# Patient Record
Sex: Male | Born: 1965 | Race: White | Hispanic: No | Marital: Married | Smoking: Never smoker
Health system: Southern US, Community
[De-identification: ages and names within clinical notes are randomized; demographics above are authoritative.]

## PROBLEM LIST (undated history)

## (undated) DIAGNOSIS — I10 Essential (primary) hypertension: Secondary | ICD-10-CM

## (undated) DIAGNOSIS — Z9289 Personal history of other medical treatment: Secondary | ICD-10-CM

## (undated) DIAGNOSIS — I2510 Atherosclerotic heart disease of native coronary artery without angina pectoris: Secondary | ICD-10-CM

## (undated) HISTORY — DX: Atherosclerotic heart disease of native coronary artery without angina pectoris: I25.10

## (undated) HISTORY — DX: Personal history of other medical treatment: Z92.89

## (undated) HISTORY — DX: Essential (primary) hypertension: I10

---
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
## 2004-04-18 HISTORY — PX: HERNIA REPAIR: SHX51

## 2008-04-18 HISTORY — PX: CARDIAC CATHETERIZATION: SHX172

## 2008-04-29 ENCOUNTER — Inpatient Hospital Stay (HOSPITAL_COMMUNITY): Admission: AD | Admit: 2008-04-29 | Discharge: 2008-04-30 | Payer: Self-pay | Admitting: Cardiovascular Disease

## 2008-05-16 ENCOUNTER — Inpatient Hospital Stay (HOSPITAL_COMMUNITY): Admission: RE | Admit: 2008-05-16 | Discharge: 2008-05-17 | Payer: Self-pay | Admitting: Cardiology

## 2009-06-09 DIAGNOSIS — Z9289 Personal history of other medical treatment: Secondary | ICD-10-CM

## 2009-06-09 HISTORY — DX: Personal history of other medical treatment: Z92.89

## 2010-08-02 LAB — BASIC METABOLIC PANEL
BUN: 9 mg/dL (ref 6–23)
BUN: 9 mg/dL (ref 6–23)
BUN: 14 mg/dL (ref 6–23)
CO2: 27 mEq/L (ref 19–32)
CO2: 27 mEq/L (ref 19–32)
Calcium: 9.1 mg/dL (ref 8.4–10.5)
Chloride: 102 mEq/L (ref 96–112)
Creatinine, Ser: 1.05 mg/dL (ref 0.4–1.5)
Creatinine, Ser: 1.23 mg/dL (ref 0.4–1.5)
GFR calc Af Amer: >60 mL/min (ref >60)
GFR calc non Af Amer: >60 mL/min (ref >60)
Glucose, Bld: 120 mg/dL — ABNORMAL HIGH (ref 70–99)
Glucose, Bld: 96 mg/dL (ref 70–99)
Potassium: 4.2 mEq/L (ref 3.5–5.1)
Potassium: 4.4 mEq/L (ref 3.5–5.1)

## 2010-08-02 LAB — CBC
HCT: 43 % (ref 39.0–52.0)
HCT: 38.2 % — ABNORMAL LOW (ref 39.0–52.0)
HCT: 38.3 % — ABNORMAL LOW (ref 39.0–52.0)
MCHC: 33.4 g/dL (ref 30.0–36.0)
MCHC: 34.2 g/dL (ref 30.0–36.0)
MCHC: 34.4 g/dL (ref 30.0–36.0)
MCV: 87 fL (ref 78.0–100.0)
MCV: 87.1 fL (ref 78.0–100.0)
Platelets: 207 10*3/uL (ref 150–400)
Platelets: 233 10*3/uL (ref 150–400)
Platelets: 194 10*3/uL (ref 150–400)
RBC: 4.39 MIL/uL (ref 4.22–5.81)
RBC: 4.41 MIL/uL (ref 4.22–5.81)
RDW: 12.6 % (ref 11.5–15.5)
RDW: 13.2 % (ref 11.5–15.5)
WBC: 5.6 10*3/uL (ref 4.0–10.5)

## 2010-08-02 LAB — DIFFERENTIAL
Basophils Relative: 1 % (ref 0–1)
Eosinophils Absolute: 0.1 10*3/uL (ref 0.0–0.7)
Eosinophils Relative: 2 % (ref 0–5)
Lymphs Abs: 1.5 10*3/uL (ref 0.7–4.0)
Monocytes Relative: 7 % (ref 3–12)
Neutrophils Relative %: 66 % (ref 43–77)

## 2010-08-02 LAB — D-DIMER, QUANTITATIVE: D-Dimer, Quant: <0.22 ug/mL-FEU (ref 0.00–0.48)

## 2010-08-02 LAB — TSH: TSH: 1.752 u[IU]/mL (ref 0.350–4.500)

## 2010-08-02 LAB — LIPID PANEL: Cholesterol: 116 mg/dL (ref 0–200)

## 2010-08-31 NOTE — Cardiovascular Report (Signed)
Tyler Acevedo, Tyler Acevedo              ACCOUNT NO.:  [id number]   MEDICAL RECORD NO.:  [id number]          PATIENT TYPE:  INP   LOCATION:  2013                         FACILITY:  [facility]   PHYSICIAN:  Richard A. Alanda Amass, M.D.DATE OF BIRTH:  August 04, 1965   DATE OF PROCEDURE:  DATE OF DISCHARGE:                            CARDIAC CATHETERIZATION   PROCEDURE:  Left coronary angiography, fractional flow reserve  measurement after hyperemia with intracoronary adenosine administration  LAD proximal, IVUS interrogation mid LAD lesion with Atlantis Pro 40 MHz  IVUS catheter.  The left femoral artery closure with 6-French Angio-Seal  device, successful.   BRIEF HISTORY:  Please refer to Dr. Verl Dicker brief H&P of April 29, 2008 and his cath report of April 29, 2008.   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  He has a family history of  coronary disease and presented to Dr. Jacinto Halim for evaluation of episodes  of chest pain felt to be compatible with ischemia.  He underwent  diagnostic catheterization on April 29, 2008 at the [facility] which demonstrated a borderline 50-60% angiographically hazy  lesion in the proximal LAD before the large first diagonal.  The  remainder of his coronaries including the LAD, diagonal system,  circumflex, and right coronary artery were entirely normal.  He also had  recent onset hypertension and had normal single renal artery in the  right and double renal artery on the left.  LV function was normal.   Because of the borderline angiographic nature of his lesion associated  with these new onset cardiac symptoms, Dr. Jacinto Halim felt it was best to  proceed with further evaluation in the cath lab.  He was transferred to  [facility]  The right groin sheath had been pulled and was stable.  Informed consent was obtained from the patient's family to proceed with  left coronary angiography and  IVUS and probable fractional flow reserve  measurement.  I reviewed the patient's coronary angiography from Dr.  Jacinto Halim and agreed that there is 50-60%, slightly hazy, nonthrombotic,  eccentric, and mildly concentric lesion of the LAD proximally.  I felt  that we needed as much information as possible, so I planned to do both  FFR and IVUS interrogation.   The left groin was prepped, draped in usual manner.  Lidocaine 1% was  used for local anesthesia and CFA was entered with a single anterior  puncture using 18 thin-wall needle and a 6-French short left femoral  arterial sheath was inserted without difficulty.  The patient was given  3000 units of heparin.  ACT was monitored and was greater than 300  seconds.  The lesion was crossed through a JL-4 6-French guiding  catheter with 0.014-inch Volcano flow wire.  The wire and guiding  catheter were easily sensitive at the left coronary ostia.  The flow  wire transducer was across the lesion and the patient was given  initially 42 mcg of intracoronary adenosine with flow wire measurements  obtained immediately following this.  A second measurement was done  after  48 mcg of intracoronary adenosine.  Fractional flow reserve was  0.91 with good waveforms present.  The flow wire was kept in place since  it was safely across the lesion in the LAD and we had elected to go  ahead and do IVUS interrogation.  This was done with an Atlantis Pro 40  MHz IVUS catheter.  This was positioned distal to the lesion for  reference measurements and automated pullback was performed through the  left main coronary artery.   IVUS interrogation revealed an eccentric soft plaque mildly segmental in  the proximal LAD as viewed angiographically.  The maximum diameter  reduction was 2.63 x 3.02 mm at the lesion site with a cross-sectional  area of 6.25 mm sq.   The reference diameter just distal to the lesion in this large LAD was  3.8 x 4.68 mm with a CSA of 14.72  mm sq.   Calculated stenosis by cross-sectional area was 59%.   This was felt to not be hemodynamically significant by angiography which  was repeated of the left coronary today, fractional flow reserve  measurement of greater than 0.91 and IVUS interrogation as outlined  above.   The patient had soft plaque with single vessel disease in a young man.  I would recommend continued medical therapy which might include dual  antiplatelet therapy, statin, and beta-blockers.  Our hopes with soft  plaque may be that this patient may develop progression of his disease.  We will have him followup as an outpatient with Dr. Jacinto Halim.   CATHETERIZATION DIAGNOSES:  1. Recent onset clinical angina.  2. Recent onset hypertension, normal renal artery.  3. Borderline proximal LAD with 50-60% angiographic stenosis at      diagnostic cath by Dr. Jacinto Halim on April 29, 2008.  4. Hemodynamically not significant lesion by fractional flow reserve      at 0.91.  5. IVUS interrogation demonstrating adequate residual diameter and CSA      as outlined above with 59% stenosis by cross-sectional area      measurement.  6. Mild hyperlipidemia.      Richard A. Alanda Amass, M.D.  Electronically Signed     RAW/MEDQ  D:  04/29/2008  T:  04/30/2008  Job:  [id number]   cc:   Cristy Hilts. Jacinto Halim, MD  Cornelius Moras Orvan Falconer,, MD

## 2010-08-31 NOTE — Cardiovascular Report (Signed)
NAMEDIMITRIOUS, MICCICHE              ACCOUNT NO.:  [id number]   MEDICAL RECORD NO.:  [id number]          PATIENT TYPE:  INP   LOCATION:  3728                         FACILITY:  [facility]   PHYSICIAN:  Cristy Hilts. Jacinto Halim, MD       DATE OF BIRTH:  10/22/1965   DATE OF PROCEDURE:  05/16/2008  DATE OF DISCHARGE:                            CARDIAC CATHETERIZATION   PROCEDURE PERFORMED:  Coronary arteriography only.   INDICATIONS:  Mr. Sakai Wolford is a 42-year gentleman who has known  intermediate coronary artery disease in the LAD.  He had a diagnostic  cardiac catheterization on April 29, 2008.  At that time, he was found  to have a hazy 60% mid LAD stenosis.  He also underwent intravascular  ultrasound and also pressure wire evaluation with the significance of  the stenosis.  It was felt to be not significant.  He had a 60%  eccentric soft plaque.  The lesion, although hazy, he was advised  aggressive medical therapy.  He has been having increasing angina  pectoris and had to use sublingual nitroglycerin.  He has also noted  shortness of breath.  Given this initially, although he was scheduled  for a stress Myoview, this was cancelled and is now brought to the  catheterization lab to reevaluate his coronary anatomy with a eye  towards angioplasty to his mid-LAD stenosis.   ANGIOGRAPHIC DATA:  Left main coronary artery:  Left main coronary  artery is a large-caliber vessel.  Smooth and normal.   Circumflex:  The circumflex is large-caliber vessel.  Smooth and normal.   LAD:  The LAD has a ostial 10% stenosis.  The mid LAD again has a  minimally hazy 30%, at most 40% stenosis eccentrically.  This is much  improved compared to the prior cardiac catheterization just a few days  ago.  The haziness is also markedly improved.   RECOMMENDATIONS:  Based on the anatomy, I do not think we should proceed  with angioplasty.  He needs to be given medical therapy only at this  time.  I will perform a  treadmill exercise stress test to evaluate his  functional status as an outpatient basis at cardiac rehab.  Ranexa to  the present medical regimen would clearly benefit his symptomatology.   TECHNIQUE OF PROCEDURE:  Under usual sterile precautions using a 7-  French right femoral artery access, a 7-French XB4 LAD guide was  utilized to engage the left main coronary artery.  Angiomax was utilized  for  anticoagulation.  Angiography was performed.  Having determined that the  stenosis was not severe enough, the procedure was abandoned and the  catheter was pulled out the body in the usual fashion.  Right femoral  arteriography was performed through the arterial access sheath and  access closed with Star-Close with excellent hemostasis.      Cristy Hilts. Jacinto Halim, MD  Electronically Signed     Cristy Hilts. Jacinto Halim, MD  Electronically Signed    JRG/MEDQ  D:  05/16/2008  T:  05/16/2008  Job:  [id number]

## 2010-08-31 NOTE — Discharge Summary (Signed)
Tyler Acevedo, Tyler Acevedo              ACCOUNT NO.:  [id number]   MEDICAL RECORD NO.:  [id number]          PATIENT TYPE:  INP   LOCATION:  2013                         FACILITY:  [facility]   PHYSICIAN:  Cristy Hilts. Jacinto Halim, MD       DATE OF BIRTH:  20-Jul-1965   DATE OF ADMISSION:  04/29/2008  DATE OF DISCHARGE:  04/30/2008                               DISCHARGE SUMMARY   Mr. Tyler Acevedo is a 45 year old male patient who had seen Dr. Jacinto Halim  in the office with hypertension and chest pain.  He was set up to have  an outpatient cardiac catheterization and this was performed at  [facility] on April 29, 2008.  It revealed a 50-60% hazy  smooth stenosis.  Dr. Jacinto Halim felt that this was a significant plaque  burden, thus he was brought to [facility] by EMS for IV ultrasound and  probable intervention to his LAD.  Plavix was started.  His renal  arteries were without any disease.  He underwent the IV ultrasound by  Dr. Susa Griffins and he had findings of extensive soft plaque  proximal LAD which was large, 2.63 x 3.0 mm diameter, 59% stenosis was  determined.  He also had fractional flow rate done with intracoronary  adenosine.  Fractional flow rate was 0.91.  Please see Dr. Rocco Serene complete cath note for details.  The following day, he was  doing well.  No intervention was done.  It was decided that he would be  treated medically and to have close followup in the office.  On April 30, 2008, his blood pressure was 95/54, pulse was 77, and temperature  was 97.   LABORATORY DATA:  Total cholesterol is 116, triglycerides is 236, HDL is  27, and LDL is 42.  Sodium 137, potassium 3.6, BUN 9, creatinine 1.05,  and glucose is 120.  Hemoglobin 13.5, hematocrit 39.4, WBC 7.5, and  platelets 233.  No chest x-ray was done.   DISCHARGE MEDICATIONS:  1. Bystolic 5 mg daily.  2. Aspirin 325 mg a day or 2 baby aspirin a day.  3. He should increase his Crestor to 20 mg daily.  4.  Start Plavix 75 mg a day.  5. Altace 2.5 mg a day.  6. Nitroglycerin spray on a p.r.n. basis 1 spray every 5 minutes x3 if      needed for chest pain.  7. Xanax 0.25 mg 1 every 8 hours if needed for anxiety.   He will follow up with Dr. Jacinto Halim on May 08, 2008, at 12:15.  he  should do no strenuous activity for a week.  No lifting, pushing, or  pulling x1 week.  If he has any problems with his groin, he is to give  our office a call.  He should not sit in a tub of water for 1 week.  He  should return to work in 2-1/2 weeks.  He should be on a low-sodium diet  and lose weight.   DISCHARGE DIAGNOSES:  1. Angina.  2. Coronary artery disease, status post cardiac catheterization with a  60% stenosis in his left anterior descending, being treated      medically for now.  He did have IV ultrasound and fractional flow      rates done.  3. Normal ejection fraction.  4. No renal artery stenosis.  5. Hypertension.  6. Dyslipidemia with low HDL and high triglycerides.  7. Elevated glucose.  Hemoglobin A1c should be evaluated as an      outpatient.  8. Obesity.  He was recommended to lose weight.      Lezlie Octave, N.P.      Cristy Hilts. Jacinto Halim, MD  Electronically Signed    BB/MEDQ  D:  04/30/2008  T:  04/30/2008  Job:  [id number]   cc:   Mila Homer. Orvan Falconer, MD

## 2010-08-31 NOTE — Discharge Summary (Signed)
Tyler Acevedo, Tyler Acevedo              ACCOUNT NO.:  [id number]   MEDICAL RECORD NO.:  [id number]          PATIENT TYPE:  INP   LOCATION:  3728                         FACILITY:  [facility]   PHYSICIAN:  Cristy Hilts. Jacinto Halim, MD       DATE OF BIRTH:  09-08-1965   DATE OF ADMISSION:  05/16/2008  DATE OF DISCHARGE:  05/17/2008                               DISCHARGE SUMMARY   DISCHARGE DIAGNOSES:  1. Coronary artery disease with chest pain.  1A.  Improved to left anterior descending stenosis reduced from 60% to  30-40% and left pacing.  1. Abnormal EKG stable.  2. Mild hypertension, now treated.  3. Family history of premature coronary artery disease.  4. Dyslipidemia.  On discharge, condition improved.  5. Elevated state of anxiety.   PROCEDURES:  Cath of the left main including the left anterior  descending, now only 30-40% stenosis and was hazy on April 29, 2008.   DISCHARGE MEDICATIONS:  1. Bystolic.  We have decreased the dose from 5 mg to 2.5 mg daily.  2. Crestor 20 mg daily.  3. Aspirin 325 mg daily.  4. Plavix 75 mg daily.  5. Ramipril.  We are holding for now, secondary to blood pressure.  6. We have added Ranexa 500 mg one twice a day.  7. Effexor XR 150 mg daily.  8. Nitroglycerin 0.4 mg sublingual as needed for chest pain.  9. Alprazolam 0.25 mg 1 every 8 hours as needed.   DISCHARGE INSTRUCTIONS:  1. No work until or after you see Dr. Jacinto Halim.  Dr. Jacinto Halim has already      given him out 2 weeks.  2. Heart-healthy diet.  3. Increase activity slowly.  May shower.  No lifting for 2 days.  No      driving for 2 days.  4. Wash cath site with soap and water.  5. Call if any bleeding, swelling, or drainage.  6. Follow up with Dr. Jacinto Halim in the week of May 19, 2008, the      office will call with date and time.  7. Please remember no ramipril for now, secondary to borderline blood      pressure.   HISTORY OF PRESENT ILLNESS:  A 45 year old white male with premature  family history  of coronary artery disease was having chest pain.  He  underwent cardiac catheterization with Dr. Alanda Amass and was found to  have 60% LAD stenosis that appeared hazy.  He was transferred to [facility] and had a intravascular ultrasound and Doppler flow wire evaluation  of the lesion severity, and was found not to be hemodynamically  significant.  He was discharged with aggressive medical therapy.  He was  then seen in followup by Dr. Jacinto Halim, May 08, 2008, for 1 episode of  chest discomfort with radiation to his left arm.  Occasional chest  discomfort of heaviness, but otherwise had felt fairly well.  He was  evaluated and the patient was somewhat anxious about the blockage and  the continued chest discomfort.  Initially, Dr. 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  The patient underwent procedure and actually  the area has improved.  Because of the left haziness, it was easier to  determine the stenosis, which is 30-40%.  The patient was reassured,  though Dr. Jacinto Halim feels he needs aggressive medical therapy.  Ranexa was  added and the patient will need a stress test in the office as well as  his cardiac rehab, but Dr. Jacinto Halim will see him next week to determine  this.  The patient will be out of work until that time.   Please note, the patient did have StarClose devices, so he should not  swim or have tub baths for 1 week.   LABORATORY DATA:  Postprocedure; hemoglobin 13.2, hematocrit 38, WBC is  6, platelets 194, and MCV 87.  Chemistry; sodium 137, potassium 4.4,  chloride 102, CO2 of 27, BUN 14, creatinine 1.23, glucose 99, and  calcium 8.9.  Chest x-ray on admission, prior to procedure no acute  disease.  Lungs were clear.  Heart size normal.  No pleural effusion or  focal abnormality.   Please note, after the procedure, after  his first dose of Ranexa, his  blood pressure had dropped to 92, had some chest discomfort and was  somewhat dizzy.  He was getting 150 mL of fluid and hour at that time,  we did a EKG and there were no acute changes.  He was bradycardic, but  only mildly.  He was reassured and symptoms resolved.  We did hold his  blood pressure medicines that night, but we did give the Ranexa.   By the morning of May 17, 2008, he was seen and evaluated by Dr.  Jacinto Halim.  Dr. Jacinto Halim felt due to his anxiety, Effexor would be a good  medication, so this was added as well as the Ranexa.  He will follow up  as an outpatient and Dr. Jacinto Halim will decide timing of treadmill test and  cardiac rehab.      Darcella Gasman. [place], N.P.      Cristy Hilts. Jacinto Halim, MD  Electronically Signed    LRI/MEDQ  D:  05/17/2008  T:  05/17/2008  Job:  [id number]   cc:   Cristy Hilts. Jacinto Halim, MD  Junious Dresser, MD

## 2012-10-22 ENCOUNTER — Other Ambulatory Visit: Payer: Self-pay | Admitting: *Deleted

## 2012-10-22 MED ORDER — RANOLAZINE ER 500 MG PO TB12: 500.0000 mg | ORAL_TABLET | Freq: Two times a day (BID) | ORAL | Status: DC

## 2012-11-09 ENCOUNTER — Encounter: Payer: Self-pay | Admitting: *Deleted

## 2012-11-12 ENCOUNTER — Encounter: Payer: Self-pay | Admitting: Internal Medicine

## 2012-11-12 ENCOUNTER — Ambulatory Visit (INDEPENDENT_AMBULATORY_CARE_PROVIDER_SITE_OTHER): Payer: BC Managed Care – PPO | Admitting: Internal Medicine

## 2012-11-12 VITALS — BP 120/82 | HR 64 | Ht 68.0 in | Wt 205.4 lb

## 2012-11-12 DIAGNOSIS — Z79899 Other long term (current) drug therapy: Secondary | ICD-10-CM

## 2012-11-12 DIAGNOSIS — I2510 Atherosclerotic heart disease of native coronary artery without angina pectoris: Secondary | ICD-10-CM

## 2012-11-12 DIAGNOSIS — E785 Hyperlipidemia, unspecified: Secondary | ICD-10-CM

## 2012-11-12 DIAGNOSIS — Z021 Encounter for pre-employment examination: Secondary | ICD-10-CM

## 2012-11-12 DIAGNOSIS — Z0289 Encounter for other administrative examinations: Secondary | ICD-10-CM

## 2012-11-12 LAB — COMPREHENSIVE METABOLIC PANEL
BUN: 11 mg/dL (ref 6–23)
CO2: 28 mEq/L (ref 19–32)
Calcium: 9 mg/dL (ref 8.4–10.5)
Chloride: 107 mEq/L (ref 96–112)
Creat: 1.1 mg/dL (ref 0.50–1.35)
Glucose, Bld: 101 mg/dL — ABNORMAL HIGH (ref 70–99)
Total Bilirubin: 0.7 mg/dL (ref 0.3–1.2)

## 2012-11-12 MED ORDER — RANOLAZINE ER 500 MG PO TB12: 500.0000 mg | ORAL_TABLET | Freq: Two times a day (BID) | ORAL | Status: DC

## 2012-11-12 NOTE — Patient Instructions (Addendum)
Your physician wants you to follow-up in: 1 year. You will receive a reminder letter in the mail two months in advance. If you don't receive a letter, please call our office to schedule the follow-up appointment.  Your physician recommends that you return for lab work today. NMR with lipid, CMP

## 2012-11-12 NOTE — Progress Notes (Signed)
OFFICE NOTE  Chief Complaint:  Yearly followup, DOT physical  Primary Care Physician: No primary provider on file.  HPI:  Tyler Acevedo.  is a 47 year old gentleman who we are following for annual DOT physicals and who is here for a history of coronary artery disease by cardiac catheterization in 2010. He had a 60% mid LAD lesion which was negative by FFR and not significant by IVUS evaluation. He did have soft plaque on IVUS, and it was recommended that he undergo intensive medical therapy, which he is currently on. 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 There has been no clear indication for repeat stress testing, and overall again he is doing well.  PMHx:  Past Medical History  Diagnosis Date  . CAD (coronary artery disease)   . Hypertension   . History of nuclear stress test 06/09/2009    exericse myoview; post stress EF 69%; evidence of mild ischemia in basal anteroseptal region    Past Surgical History  Procedure Laterality Date  . Cardiac catheterization  2010    LA has ostial 10% stenosis, mid LAD hazy 30-40% stenosis  . Wrist surgery  1982  . Nose surgery  1985  . Knee surgery  1987  . Hernia repair  2006    FAMHx:  Family History  Problem Relation Age of Onset  . Heart attack Father   . Hypertension Father   . Other Paternal Grandmother     Parkinson's    SOCHx:   reports that he has never smoked. He has never used smokeless tobacco. He reports that he does not drink alcohol or use illicit drugs.  ALLERGIES:  No Known Allergies  ROS: A comprehensive review of systems was negative.  HOME MEDS: Current Outpatient Prescriptions  Medication Sig Dispense Refill  . aspirin 81 MG tablet Take 81 mg by mouth daily.      Marland Kitchen atorvastatin (LIPITOR) 80 MG tablet Take 80 mg by mouth daily.      . clopidogrel (PLAVIX) 75 MG tablet Take 75 mg by mouth daily.      .  metoprolol succinate (TOPROL-XL) 25 MG 24 hr tablet Take 25 mg by mouth daily.      . Omega-3 Fatty Acids (FISH OIL) 300 MG CAPS Take 2 capsules by mouth daily.      . ranolazine (RANEXA) 500 MG 12 hr tablet Take 1 tablet (500 mg total) by mouth 2 (two) times daily.  56 tablet  0  . venlafaxine XR (EFFEXOR-XR) 37.5 MG 24 hr capsule Take 37.5 mg by mouth daily.      . vitamin Acevedo (ASCORBIC ACID) 500 MG tablet Take 500 mg by mouth daily.       No current facility-administered medications for this visit.    LABS/IMAGING: No results found for this or any previous visit (from the past 48 hour(s)). No results found.  VITALS: Pulse 64  Ht 5\' 8"  (1.727 m)  Wt 205 lb 6.4 oz (93.169 kg)  BMI 31.24 kg/m2  EXAM: General appearance: alert and no distress Neck: no adenopathy, no carotid bruit, no JVD, supple, symmetrical, trachea midline and thyroid not enlarged, symmetric, no tenderness/mass/nodules Lungs: clear to auscultation bilaterally Heart: regular rate and rhythm, S1, S2 normal, no murmur, click, rub or gallop Abdomen: soft, non-tender; bowel sounds normal; no masses,  no organomegaly Extremities: extremities normal, atraumatic, no cyanosis or edema Pulses: 2+ and symmetric Skin: Skin  color, texture, turgor normal. No rashes or lesions Neurologic: Grossly normal  EKG: Normal sinus rhythm at 64  ASSESSMENT: 1. Coronary artery disease with a 60% mid LAD lesion in 2010 2. Dyslipidemia 3. Hypertension  PLAN: 1.   [name] continues to do well without any recurrent chest pain. He has done very well with Ranexa and wants to continue to use that. He is overdue for a lipid profile testing but is continuing to take high-dose Lipitor. His exercise could be better, but he maintains activity and strenuous lifting without any chest pain or worsening shortness of breath. 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 I will contact him with the results of his blood work and have encouraged him to followup with a primary care provider.  Chrystie Nose, MD, [facility] Attending Cardiologist [facility]  Tyler Acevedo,Tyler Acevedo 11/12/2012, 10:26 AM

## 2012-11-13 LAB — NMR LIPOPROFILE WITH LIPIDS
Cholesterol, Total: 85 mg/dL (ref <200)
HDL Particle Number: 34 umol/L (ref >=30.5)
LDL (calc): 28 mg/dL (ref <100)
LP-IR Score: 53 — ABNORMAL HIGH (ref <=45)
Large VLDL-P: 2.1 nmol/L (ref <=2.7)
Small LDL Particle Number: 129 nmol/L (ref <=527)

## 2012-11-15 NOTE — Progress Notes (Signed)
Left voicemail for patient to return call.

## 2012-11-16 ENCOUNTER — Telehealth: Payer: Self-pay | Admitting: Internal Medicine

## 2012-11-16 DIAGNOSIS — E782 Mixed hyperlipidemia: Secondary | ICD-10-CM

## 2012-11-16 NOTE — Telephone Encounter (Signed)
Message copied by Chauncey Reading on Fri Nov 16, 2012 10:22 AM ------      Message from: Chrystie Nose      Created: Tue Nov 13, 2012  1:23 PM       Please inform him that his cholesterol is remarkably low. LDL is 28, total LDL particle number 333 (should be <1000). I would recommend reducing his lipitor to 40 mg daily (to reduce side effects) and order a re-check lipid NMR in 3 months.             Dr. Rennis Golden ------

## 2012-11-16 NOTE — Telephone Encounter (Signed)
Patient states that he is returning a call about his lab results.

## 2012-11-16 NOTE — Telephone Encounter (Signed)
Call to pt and results given per MD.  Pt verbalized understanding.    Labs ordered and slip mailed.

## 2012-12-03 ENCOUNTER — Telehealth: Payer: Self-pay | Admitting: Internal Medicine

## 2012-12-03 MED ORDER — ATORVASTATIN CALCIUM 40 MG PO TABS: 40.0000 mg | ORAL_TABLET | Freq: Every day | ORAL | Status: DC

## 2012-12-03 MED ORDER — RANOLAZINE ER 500 MG PO TB12: 500.0000 mg | ORAL_TABLET | Freq: Two times a day (BID) | ORAL | Status: DC

## 2012-12-03 MED ORDER — CLOPIDOGREL BISULFATE 75 MG PO TABS: 75.0000 mg | ORAL_TABLET | Freq: Every day | ORAL | Status: DC

## 2012-12-03 NOTE — Telephone Encounter (Signed)
Returned call Tyler Acevedo, pt's wife.  Stated pt is needing refill on cholesterol med.  Stated dose was reduced and new Rx not sent to pharmacy.  Informed Rx will be sent.  Wife stated pt is due for refills on almost all his meds, including Effexor.  Informed RN can send in refills on all meds except Effexor, which will need to be approved by Dr. Rennis Golden.  Verbalized understanding and stated it is not due quite yet.  Wife informed of refill process and verbalized understanding.  Requested 90-day supply refills on Atorvastatin and Plavix and 30-day supply on Ranexa.  Unable to come get samples.  Informed they will be sent.  Verbalized understanding.  Refill(s) sent to pharmacy.

## 2012-12-03 NOTE — Telephone Encounter (Signed)
Wife states Tyler Acevedo cholesterol med was to be reduced and the pharmacy has not done that.. She has questions about his medications.

## 2013-02-11 ENCOUNTER — Other Ambulatory Visit: Payer: Self-pay | Admitting: Internal Medicine

## 2013-02-11 NOTE — Telephone Encounter (Signed)
Rx was sent to pharmacy electronically. 

## 2013-02-19 ENCOUNTER — Other Ambulatory Visit: Payer: Self-pay | Admitting: Internal Medicine

## 2013-06-20 ENCOUNTER — Telehealth: Payer: Self-pay | Admitting: Internal Medicine

## 2013-06-20 MED ORDER — RANOLAZINE ER 500 MG PO TB12: 500.0000 mg | ORAL_TABLET | Freq: Two times a day (BID) | ORAL | Status: DC

## 2013-06-20 NOTE — Telephone Encounter (Signed)
Need some samples of Ranexa please. °

## 2013-06-20 NOTE — Telephone Encounter (Signed)
Notified patient that samples will be at front desk

## 2013-08-01 ENCOUNTER — Other Ambulatory Visit: Payer: Self-pay | Admitting: Internal Medicine

## 2013-08-01 NOTE — Telephone Encounter (Signed)
Rx was sent to pharmacy electronically. 

## 2013-09-03 ENCOUNTER — Telehealth: Payer: Self-pay | Admitting: Internal Medicine

## 2013-09-05 ENCOUNTER — Ambulatory Visit: Payer: BC Managed Care – PPO | Admitting: Internal Medicine

## 2013-09-05 NOTE — Telephone Encounter (Signed)
Closed encounter °

## 2013-10-14 ENCOUNTER — Encounter: Payer: Self-pay | Admitting: Internal Medicine

## 2013-10-14 ENCOUNTER — Ambulatory Visit (INDEPENDENT_AMBULATORY_CARE_PROVIDER_SITE_OTHER): Payer: BC Managed Care – PPO | Admitting: Internal Medicine

## 2013-10-14 VITALS — BP 130/88 | HR 64 | Ht 68.0 in | Wt 209.8 lb

## 2013-10-14 DIAGNOSIS — I10 Essential (primary) hypertension: Secondary | ICD-10-CM | POA: Insufficient documentation

## 2013-10-14 DIAGNOSIS — I2510 Atherosclerotic heart disease of native coronary artery without angina pectoris: Secondary | ICD-10-CM | POA: Insufficient documentation

## 2013-10-14 DIAGNOSIS — I2583 Coronary atherosclerosis due to lipid rich plaque: Secondary | ICD-10-CM

## 2013-10-14 DIAGNOSIS — E785 Hyperlipidemia, unspecified: Secondary | ICD-10-CM

## 2013-10-14 DIAGNOSIS — E782 Mixed hyperlipidemia: Secondary | ICD-10-CM

## 2013-10-14 MED ORDER — RANOLAZINE ER 500 MG PO TB12: 500.0000 mg | ORAL_TABLET | Freq: Two times a day (BID) | ORAL | Status: DC

## 2013-10-14 NOTE — Patient Instructions (Signed)
Your physician recommends that you return for lab work in: TODAY at [facility].  Your physician recommends that you schedule a follow-up appointment in: Dr. Rennis GoldenHilty in one year.

## 2013-10-14 NOTE — Progress Notes (Signed)
OFFICE NOTE  Chief Complaint:  Yearly followup, DOT physical  Primary Care Physician: No PCP Per Patient  HPI:  Tyler AngJoseph D Chiaramonte Jr.  is a 48 year old gentleman who we are following for annual DOT Acevedo and who is here for a history of coronary artery disease by cardiac catheterization in 2010. He had a 60% mid LAD lesion which was negative by FFR and not significant by IVUS evaluation. He did have soft plaque on IVUS, and it was recommended that he undergo intensive medical therapy, which he is currently on. 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 There has been no clear indication for repeat stress testing, and overall again he is doing well.  Tyler Acevedo has no new complaints. 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 He has maintained on Ranexa, however I wonder what the necessity is at this point. He's had marked improvement in his cholesterol profile, in fact most recently his lipid profile showed particle number of 333, and LDL of 28. Based on this I decreased his Lipitor back to 40 mg daily. We have also had discussions about possibly coming off of Ranexa which she's been hesitant to do in the past however I feel that he can do this and see what his symptoms are. Should he have more angina. He would need to go back on the medication  PMHx:  Past Medical History  Diagnosis Date  . CAD (coronary artery disease)   . Hypertension   . History of nuclear stress test 06/09/2009    exericse myoview; post stress EF 69%; evidence of mild ischemia in basal anteroseptal region    Past Surgical History  Procedure Laterality Date  . Cardiac catheterization  2010    LA has ostial 10% stenosis, mid LAD hazy 30-40% stenosis  . Wrist surgery  1982  . Nose surgery  1985  . Knee surgery  1987  . Hernia repair  2006     FAMHx:  Family History  Problem Relation Age of Onset  . Heart attack Father   . Hypertension Father   . Other Paternal Grandmother     Parkinson's    SOCHx:   reports that he has never smoked. He has never used smokeless tobacco. He reports that he does not drink alcohol or use illicit drugs.  ALLERGIES:  No Known Allergies  ROS: A comprehensive review of systems was negative.  HOME MEDS: Current Outpatient Prescriptions  Medication Sig Dispense Refill  . aspirin 81 MG tablet Take 81 mg by mouth daily.      Marland Kitchen. atorvastatin (LIPITOR) 40 MG tablet Take 1 tablet (40 mg total) by mouth daily.  90 tablet  3  . clopidogrel (PLAVIX) 75 MG tablet Take 1 tablet (75 mg total) by mouth daily.  90 tablet  3  . metoprolol succinate (TOPROL-XL) 25 MG 24 hr tablet TAKE 1 TABLET EVERY DAY  90 tablet  2  . Omega-3 Fatty Acids (FISH OIL) 300 MG CAPS Take 2 capsules by mouth daily.      . ranolazine (RANEXA) 500 MG 12 hr tablet Take 1 tablet (500 mg total) by mouth 2 (two) times daily.  56 tablet  0  . venlafaxine XR (EFFEXOR-XR) 37.5 MG 24 hr capsule TAKE 1 CAPSULE BY MOUTH DAILY  90 capsule  1  . vitamin C (ASCORBIC  ACID) 500 MG tablet Take 1,000 mg by mouth daily.        No current facility-administered medications for this visit.    LABS/IMAGING: No results found for this or any previous visit (from the past 48 hour(s)). No results found.  VITALS: BP 130/88  Pulse 64  Ht 5\' 8"  (1.727 m)  Wt 209 lb 12.8 oz (95.165 kg)  BMI 31.91 kg/m2  EXAM: General appearance: alert and no distress Neck: no adenopathy, no carotid bruit, no JVD, supple, symmetrical, trachea midline and thyroid not enlarged, symmetric, no tenderness/mass/nodules Lungs: clear to auscultation bilaterally Heart: regular rate and rhythm, S1, S2 normal, no murmur, click, rub or gallop Abdomen: soft, non-tender; bowel sounds normal; no masses,  no organomegaly Extremities: extremities normal, atraumatic, no cyanosis  or edema Pulses: 2+ and symmetric Skin: Skin color, texture, turgor normal. No rashes or lesions Neurologic: Grossly normal  EKG: Normal sinus rhythm at 64  ASSESSMENT: 1. Coronary artery disease with a 60% mid LAD lesion in 2010 2. Dyslipidemia - at goal 3. Hypertension - controlled  PLAN: 1.   Tyler Acevedo continues to be asymptomatic and has had no coronary events since he was started on medical therapy for his angina. I'm not convinced that he continues to need Ranexa and it is quite expensive for them. We will go ahead and trial. Off of his Ranexa to see if he is okay without any chest pain. Should he have more angina, he should restart his medication and notify me. I would recheck his lipid profile to see if it is adequate on Lipitor. Finally his hypertension is well controlled. I have provided the clearance to return to DOT activities. 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 Plan to see him back annually or sooner as necessary.  Tyler NoseKenneth C. Hilty, MD, [facility] Attending Cardiologist [facility]  Acevedo,Kenneth C 10/14/2013, 10:16 AM

## 2013-10-15 ENCOUNTER — Encounter: Payer: Self-pay | Admitting: *Deleted

## 2013-10-15 LAB — NMR LIPOPROFILE WITH LIPIDS
Cholesterol, Total: 131 mg/dL (ref <200)
HDL PARTICLE NUMBER: 34.4 umol/L (ref >=30.5)
HDL Size: 8.4 nm — ABNORMAL LOW (ref >=9.2)
HDL-C: 46 mg/dL (ref >=40)
LARGE HDL: 2.1 umol/L — AB (ref >=4.8)
LDL (calc): 52 mg/dL (ref <100)
LDL Particle Number: 708 nmol/L (ref <1000)
LDL SIZE: 20.2 nm — AB (ref >20.5)
LP-IR Score: 60 — ABNORMAL HIGH (ref <=45)
Large VLDL-P: 1.9 nmol/L (ref <=2.7)
SMALL LDL PARTICLE NUMBER: 420 nmol/L (ref <=527)
Triglycerides: 165 mg/dL — ABNORMAL HIGH (ref <150)
VLDL Size: 42 nm (ref <=46.6)

## 2013-10-30 ENCOUNTER — Other Ambulatory Visit: Payer: Self-pay | Admitting: Internal Medicine

## 2013-10-30 NOTE — Telephone Encounter (Signed)
Rx was sent to pharmacy electronically. 

## 2014-01-30 ENCOUNTER — Other Ambulatory Visit: Payer: Self-pay | Admitting: Internal Medicine

## 2014-05-07 ENCOUNTER — Other Ambulatory Visit: Payer: Self-pay | Admitting: *Deleted

## 2014-05-07 MED ORDER — ATORVASTATIN CALCIUM 40 MG PO TABS: ORAL_TABLET | ORAL | Status: DC

## 2014-05-07 MED ORDER — METOPROLOL SUCCINATE ER 25 MG PO TB24: 25.0000 mg | ORAL_TABLET | Freq: Every day | ORAL | Status: DC

## 2014-05-07 MED ORDER — CLOPIDOGREL BISULFATE 75 MG PO TABS: 75.0000 mg | ORAL_TABLET | Freq: Every day | ORAL | Status: DC

## 2014-05-07 NOTE — Telephone Encounter (Signed)
Rx refill sent to patient pharmacy   

## 2014-05-08 MED ORDER — VENLAFAXINE HCL ER 37.5 MG PO CP24: 37.5000 mg | ORAL_CAPSULE | Freq: Every day | ORAL | Status: DC

## 2014-05-08 NOTE — Telephone Encounter (Signed)
Ok to refill 

## 2014-07-16 ENCOUNTER — Telehealth: Payer: Self-pay | Admitting: Internal Medicine

## 2014-07-17 ENCOUNTER — Telehealth (HOSPITAL_COMMUNITY): Payer: Self-pay | Admitting: *Deleted

## 2014-07-17 DIAGNOSIS — Z0289 Encounter for other administrative examinations: Secondary | ICD-10-CM

## 2014-07-17 DIAGNOSIS — I2510 Atherosclerotic heart disease of native coronary artery without angina pectoris: Secondary | ICD-10-CM

## 2014-07-17 NOTE — Telephone Encounter (Signed)
Ok to order exercise myoview for DOT purposes.  Dr. HRexene Edison

## 2014-07-17 NOTE — Telephone Encounter (Signed)
Pt needs a stress test ordered. He says that when he saw Dr.Hilty last he stated that it would be ok to have it done in June. Please put order in epic if Dr.Hilty approves.  Thanks

## 2014-07-17 NOTE — Telephone Encounter (Signed)
Stress test ordered. Encounter routed to [place] to schedule

## 2014-07-17 NOTE — Telephone Encounter (Signed)
Routed to Dr. Hilty to advise 

## 2014-07-21 ENCOUNTER — Telehealth (HOSPITAL_COMMUNITY): Payer: Self-pay | Admitting: *Deleted

## 2014-07-21 NOTE — Telephone Encounter (Signed)
Close encounter 

## 2014-09-17 ENCOUNTER — Telehealth (HOSPITAL_COMMUNITY): Payer: Self-pay

## 2014-09-17 NOTE — Telephone Encounter (Signed)
Encounter complete. 

## 2014-09-19 ENCOUNTER — Encounter: Payer: Self-pay | Admitting: Internal Medicine

## 2014-09-19 ENCOUNTER — Telehealth (HOSPITAL_COMMUNITY): Payer: Self-pay

## 2014-09-19 ENCOUNTER — Ambulatory Visit (INDEPENDENT_AMBULATORY_CARE_PROVIDER_SITE_OTHER): Payer: 59 | Admitting: Internal Medicine

## 2014-09-19 ENCOUNTER — Ambulatory Visit (HOSPITAL_COMMUNITY)
Admission: RE | Admit: 2014-09-19 | Discharge: 2014-09-19 | Disposition: A | Discharge status: Home / Self Care | Payer: 59 | Source: Ambulatory Visit | Attending: Internal Medicine | Admitting: Internal Medicine

## 2014-09-19 VITALS — BP 104/76 | HR 59 | Ht 68.0 in | Wt 204.5 lb

## 2014-09-19 DIAGNOSIS — I2510 Atherosclerotic heart disease of native coronary artery without angina pectoris: Secondary | ICD-10-CM | POA: Diagnosis not present

## 2014-09-19 DIAGNOSIS — Z0289 Encounter for other administrative examinations: Secondary | ICD-10-CM | POA: Diagnosis not present

## 2014-09-19 DIAGNOSIS — I10 Essential (primary) hypertension: Secondary | ICD-10-CM

## 2014-09-19 DIAGNOSIS — E785 Hyperlipidemia, unspecified: Secondary | ICD-10-CM

## 2014-09-19 DIAGNOSIS — I2583 Coronary atherosclerosis due to lipid rich plaque: Secondary | ICD-10-CM

## 2014-09-19 LAB — MYOCARDIAL PERFUSION IMAGING
CHL CUP MPHR: 174 {beats}/min
CHL CUP NUCLEAR SRS: 0
CHL CUP STRESS STAGE 1 SPEED: 0 mph
CHL CUP STRESS STAGE 4 GRADE: 10 %
CHL CUP STRESS STAGE 4 SPEED: 1.7 mph
CHL CUP STRESS STAGE 5 GRADE: 12 %
CHL CUP STRESS STAGE 5 SBP: 149 mmHg
CHL CUP STRESS STAGE 5 SPEED: 2.5 mph
CHL CUP STRESS STAGE 6 SPEED: 3.4 mph
CHL CUP STRESS STAGE 7 DBP: 89 mmHg
CHL CUP STRESS STAGE 7 SPEED: 4.2 mph
CHL CUP STRESS STAGE 8 DBP: 64 mmHg
CHL CUP STRESS STAGE 8 SBP: 154 mmHg
CHL CUP STRESS STAGE 8 SPEED: 0 mph
CHL CUP STRESS STAGE 9 SBP: 121 mmHg
CHL CUP STRESS STAGE 9 SPEED: 0 mph
CSEPED: 11 min
CSEPPMHR: 102 %
Estimated workload: 13.4 METS
Exercise duration (sec): 30 s
LV dias vol: 93 mL
LVSYSVOL: 31 mL
Nuc Stress EF: 67 %
Peak BP: 155 mmHg
Peak HR: 179 {beats}/min
Percent HR: 102 %
RPE: 30967
Rest HR: 62 {beats}/min
SDS: 0
SSS: 0
Stage 1 DBP: 77 mmHg
Stage 1 Grade: 0 %
Stage 1 HR: 80 {beats}/min
Stage 1 SBP: 127 mmHg
Stage 2 Grade: 0 %
Stage 2 HR: 75 {beats}/min
Stage 2 Speed: 0.8 mph
Stage 3 Grade: 0 %
Stage 3 HR: 74 {beats}/min
Stage 3 Speed: 1 mph
Stage 4 DBP: 64 mmHg
Stage 4 HR: 94 {beats}/min
Stage 4 SBP: 107 mmHg
Stage 5 DBP: 74 mmHg
Stage 5 HR: 121 {beats}/min
Stage 6 DBP: 78 mmHg
Stage 6 Grade: 14 %
Stage 6 HR: 142 {beats}/min
Stage 6 SBP: 162 mmHg
Stage 7 Grade: 16 %
Stage 7 HR: 179 {beats}/min
Stage 7 SBP: 155 mmHg
Stage 8 Grade: 0 %
Stage 8 HR: 155 {beats}/min
Stage 9 DBP: 67 mmHg
Stage 9 Grade: 0 %
Stage 9 HR: 96 {beats}/min
TID: 0.77

## 2014-09-19 MED ORDER — TECHNETIUM TC 99M SESTAMIBI GENERIC - CARDIOLITE
10.8000 | Freq: Once | INTRAVENOUS | Status: AC | PRN
  Administered 2014-09-19: 11 via INTRAVENOUS

## 2014-09-19 MED ORDER — TECHNETIUM TC 99M SESTAMIBI GENERIC - CARDIOLITE
31.0000 | Freq: Once | INTRAVENOUS | Status: AC | PRN
  Administered 2014-09-19: 31 via INTRAVENOUS

## 2014-09-19 NOTE — Telephone Encounter (Signed)
Encounter complete. 

## 2014-09-19 NOTE — Patient Instructions (Addendum)
Your physician wants you to follow-up in: 1 year Dr. Rennis GoldenHilty. You will receive a reminder letter in the mail two months in advance. If you don't receive a letter, please call our office to schedule the follow-up appointment.  Please have fasting lab work to check your cholesterol

## 2014-09-19 NOTE — Progress Notes (Signed)
OFFICE NOTE  Chief Complaint:  Yearly followup, DOT physical  Primary Care Physician: No PCP Per Patient  HPI:  Tyler AngJoseph D Guidroz Jr.  is a [id number] year old gentleman who we are following for annual DOT physicals and who is here for a history of coronary artery disease by cardiac catheterization in 2010. He had a 60% mid LAD lesion which was negative by FFR and not significant by IVUS evaluation. He did have soft plaque on IVUS, and it was recommended that he undergo intensive medical therapy, which he is currently on. 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 There has been no clear indication for repeat stress testing, and overall again he is doing well.  Tyler Acevedo has no new complaints. 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 He has maintained on Ranexa, however I wonder what the necessity is at this point. He's had marked improvement in his cholesterol profile, in fact most recently his lipid profile showed particle number of 333, and LDL of 28. Based on this I decreased his Lipitor back to 40 mg daily. We have also had discussions about possibly coming off of Ranexa which she's been hesitant to do in the past however I feel that he can do this and see what his symptoms are. Should he have more angina. He would need to go back on the medication  I saw Tyler Acevedo back in the office today. He has no complaints. He denies any chest pain or shortness of breath. He remains physically active. He is due for repeat lipid profile. He is scheduled to have a stress test today for DOT purposes. Stress test to this point have been negative for ischemia, in spite of a 60% LAD lesions which was not flow limiting.  PMHx:  Past Medical History  Diagnosis Date  . CAD (coronary artery disease)   . Hypertension   . History of nuclear  stress test 06/09/2009    exericse myoview; post stress EF 69%; evidence of mild ischemia in basal anteroseptal region    Past Surgical History  Procedure Laterality Date  . Cardiac catheterization  2010    LA has ostial 10% stenosis, mid LAD hazy 30-40% stenosis  . Wrist surgery  1982  . Nose surgery  1985  . Knee surgery  1987  . Hernia repair  2006    FAMHx:  Family History  Problem Relation Age of Onset  . Heart attack Father   . Hypertension Father   . Other Paternal Grandmother     Parkinson's    SOCHx:   reports that he has never smoked. He has never used smokeless tobacco. He reports that he does not drink alcohol or use illicit drugs.  ALLERGIES:  No Known Allergies  ROS: A comprehensive review of systems was negative.  HOME MEDS: Current Outpatient Prescriptions  Medication Sig Dispense Refill  . aspirin 81 MG tablet Take 81 mg by mouth daily.    Marland Kitchen. atorvastatin (LIPITOR) 40 MG tablet TAKE 1 TABLET (40 MG TOTAL) BY MOUTH DAILY. 90 tablet 1  . clopidogrel (PLAVIX) 75 MG tablet Take 1 tablet (75 mg total) by mouth daily. 90 tablet 1  . metoprolol succinate (TOPROL-XL) 25 MG 24 hr tablet Take 1 tablet (25 mg total) by mouth daily. 90 tablet 1  . Omega-3 Fatty Acids (FISH OIL) 300  MG CAPS Take 2 capsules by mouth daily.    Marland Kitchen venlafaxine XR (EFFEXOR-XR) 37.5 MG 24 hr capsule Take 1 capsule (37.5 mg total) by mouth daily. 90 capsule 1  . vitamin C (ASCORBIC ACID) 500 MG tablet Take 1,000 mg by mouth daily.      No current facility-administered medications for this visit.    LABS/IMAGING: No results found for this or any previous visit (from the past 48 hour(s)). No results found.  VITALS: BP 104/76 mmHg  Pulse 59  Ht  (1.727 m)  Wt 204 lb 8 oz (92.761 kg)  BMI 31.10 kg/m2  EXAM: General appearance: alert and no distress Neck: no adenopathy, no carotid bruit, no JVD, supple, symmetrical, trachea midline and thyroid not enlarged, symmetric, no  tenderness/mass/nodules Lungs: clear to auscultation bilaterally Heart: regular rate and rhythm, S1, S2 normal, no murmur, click, rub or gallop Abdomen: soft, non-tender; bowel sounds normal; no masses,  no organomegaly Extremities: extremities normal, atraumatic, no cyanosis or edema Pulses: 2+ and symmetric Skin: Skin color, texture, turgor normal. No rashes or lesions Neurologic: Grossly normal  EKG: Sinus bradycardia 59  ASSESSMENT: 1. Coronary artery disease with a 60% mid LAD lesion in 2010 2. Dyslipidemia - at goal 3. Hypertension - controlled  PLAN: 1.   Tyler Acevedo continues to be asymptomatic and has had no coronary events since he was started on medical therapy for his angina. He has been off of Ranexa for a year has had no chest pain complaints. He stays on aspirin and Plavix. He is due for repeat lipid profile. He has a stress test today for DOT purposes. I'll contact him with those results. Blood pressure is also well controlled. Of encouraged him to remain active and plan to see him back annually or sooner as necessary.  Chrystie Nose, MD, [facility] Attending Cardiologist [facility]   Tyler Acevedo 09/19/2014, 11:30 AM

## 2014-09-22 LAB — NMR LIPOPROFILE WITH LIPIDS
Cholesterol, Total: 110 mg/dL (ref 100–199)
HDL Particle Number: 31.6 umol/L (ref >=30.5)
HDL Size: 8.7 nm — ABNORMAL LOW (ref >=9.2)
HDL-C: 44 mg/dL (ref >39)
LARGE HDL: 3.4 umol/L — AB (ref >=4.8)
LDL CALC: 40 mg/dL (ref 0–99)
LDL Particle Number: 472 nmol/L (ref <1000)
LDL Size: 20.2 nm (ref >=20.8)
LP-IR SCORE: 36 (ref <=45)
Large VLDL-P: <0.8 nmol/L (ref <=2.7)
Small LDL Particle Number: 224 nmol/L (ref <=527)
TRIGLYCERIDES: 131 mg/dL (ref 0–149)
VLDL Size: 41.1 nm (ref <=46.6)

## 2014-09-30 ENCOUNTER — Telehealth: Payer: Self-pay | Admitting: Internal Medicine

## 2014-09-30 NOTE — Telephone Encounter (Signed)
Pt called in stating that he only received 2 of the 3 pages of his stress test results and he wanted to make sure that  page 3 didn't have anything important on it. Please call back  Thanks

## 2014-09-30 NOTE — Telephone Encounter (Signed)
He received his stress test results,now he needs to ask a question about it.

## 2014-09-30 NOTE — Telephone Encounter (Signed)
Pt explains Jenna sent copy of stress test report, left last page off. Wanted to know if anything important on this. Informed him this last page would have order req info, associated diagnoses, etc - nothing to do w/ result of test. He voiced understanding.

## 2014-09-30 NOTE — Telephone Encounter (Signed)
Left message for patient to call back  

## 2014-11-01 ENCOUNTER — Other Ambulatory Visit: Payer: Self-pay | Admitting: Internal Medicine

## 2015-04-27 ENCOUNTER — Encounter: Payer: Self-pay | Admitting: Internal Medicine

## 2015-04-28 ENCOUNTER — Telehealth: Payer: Self-pay | Admitting: *Deleted

## 2015-04-28 NOTE — Telephone Encounter (Signed)
04/27/2015 Letter stating physical and mental ability to carry a concealed handgun faxed [place] [employer].

## 2015-08-10 ENCOUNTER — Other Ambulatory Visit: Payer: Self-pay | Admitting: Internal Medicine

## 2015-08-10 NOTE — Telephone Encounter (Signed)
Rx(s) sent to pharmacy electronically.  

## 2015-09-17 ENCOUNTER — Encounter: Payer: Self-pay | Admitting: Internal Medicine

## 2015-09-17 ENCOUNTER — Ambulatory Visit (INDEPENDENT_AMBULATORY_CARE_PROVIDER_SITE_OTHER): Payer: 59 | Admitting: Internal Medicine

## 2015-09-17 VITALS — BP 120/78 | HR 63 | Ht 68.0 in | Wt 199.0 lb

## 2015-09-17 DIAGNOSIS — Z0289 Encounter for other administrative examinations: Secondary | ICD-10-CM

## 2015-09-17 DIAGNOSIS — I2510 Atherosclerotic heart disease of native coronary artery without angina pectoris: Secondary | ICD-10-CM

## 2015-09-17 DIAGNOSIS — E785 Hyperlipidemia, unspecified: Secondary | ICD-10-CM | POA: Diagnosis not present

## 2015-09-17 DIAGNOSIS — I10 Essential (primary) hypertension: Secondary | ICD-10-CM | POA: Insufficient documentation

## 2015-09-17 DIAGNOSIS — I2583 Coronary atherosclerosis due to lipid rich plaque: Secondary | ICD-10-CM

## 2015-09-17 MED ORDER — NITROGLYCERIN 0.4 MG SL SUBL: 0.4000 mg | SUBLINGUAL_TABLET | SUBLINGUAL | Status: DC | PRN

## 2015-09-17 NOTE — Progress Notes (Signed)
OFFICE NOTE  Chief Complaint:  Yearly followup, DOT physical  Primary Care Physician: No PCP Per Patient  HPI:  Tyler AngJoseph D Bonsell Jr.  is a [age] year old gentleman who we are following for annual DOT physicals and who is here for a history of coronary artery disease by cardiac catheterization in 2010. He had a 60% mid LAD lesion which was negative by FFR and not significant by IVUS evaluation. He did have soft plaque on IVUS, and it was recommended that he undergo intensive medical therapy, which he is currently on. 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 There has been no clear indication for repeat stress testing, and overall again he is doing well.  Tyler Acevedo has no new complaints. 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 He has maintained on Ranexa, however I wonder what the necessity is at this point. He's had marked improvement in his cholesterol profile, in fact most recently his lipid profile showed particle number of 333, and LDL of 28. Based on this I decreased his Lipitor back to 40 mg daily. We have also had discussions about possibly coming off of Ranexa which she's been hesitant to do in the past however I feel that he can do this and see what his symptoms are. Should he have more angina. He would need to go back on the medication  I saw Tyler Acevedo back in the office today. He has no complaints. He denies any chest pain or shortness of breath. He remains physically active. He is due for repeat lipid profile. He is scheduled to have a stress test today for DOT purposes. Stress test to this point have been negative for ischemia, in spite of a 60% LAD lesions which was not flow limiting.  09/17/2015  Tyler Acevedo returns today for follow-up. Recently he's had a lot of trouble apparently with stress in his  life including problems with work and seemed to be not his usual self today. His wife has been concerned about him but he does not seem to be manifesting any coronary symptoms. He did have an episode where he had some stomach pain and was followed by chest discomfort and low-grade temperature. He did not have nausea or diarrhea was thought to possibly have flulike symptoms including achy joints. He also reports some occasional discomfort in his chest when he gets very upset but that seems to resolve when he rests. On the other hand, he is able to exercise including doing work on treadmill and walking at a fast pace with his wife up and down hills without any chest discomfort or limitations. He's been working on restoring a pontoon boat and doing heavy lifting without any limitations.  PMHx:  Past Medical History  Diagnosis Date  . CAD (coronary artery disease)   . Hypertension   . History of nuclear stress test 06/09/2009    exericse myoview; post stress EF 69%; evidence of mild ischemia in basal anteroseptal region    Past Surgical History  Procedure Laterality Date  . Cardiac catheterization  2010    LA has ostial 10% stenosis, mid LAD hazy 30-40% stenosis  . Wrist surgery  1982  . Nose surgery  1985  . Knee surgery  1987  . Hernia repair  2006    FAMHx:  Family History  Problem Relation Age of  Onset  . Heart attack Father   . Hypertension Father   . Other Paternal Grandmother     Parkinson's    SOCHx:   reports that he has never smoked. He has never used smokeless tobacco. He reports that he does not drink alcohol or use illicit drugs.  ALLERGIES:  No Known Allergies  ROS: A comprehensive review of systems was negative.  HOME MEDS: Current Outpatient Prescriptions  Medication Sig Dispense Refill  . aspirin 81 MG tablet Take 81 mg by mouth daily.    Marland Kitchen atorvastatin (LIPITOR) 40 MG tablet Take 1 tablet (40 mg total) by mouth daily at 6 PM. PLEASE CONTACT OFFICE FOR  ADDITIONAL REFILLS 90 tablet 0  . clopidogrel (PLAVIX) 75 MG tablet Take 1 tablet (75 mg total) by mouth daily. PLEASE CONTACT OFFICE FOR ADDITIONAL REFILLS 90 tablet 0  . metoprolol succinate (TOPROL-XL) 25 MG 24 hr tablet Take 1 tablet (25 mg total) by mouth daily. PLEASE CONTACT OFFICE FOR ADDITIONAL REFILLS 90 tablet 0  . Omega-3 Fatty Acids (FISH OIL) 300 MG CAPS Take 2 capsules by mouth daily.    Marland Kitchen venlafaxine XR (EFFEXOR-XR) 37.5 MG 24 hr capsule Take 1 capsule (37.5 mg total) by mouth daily with breakfast. PLEASE CONTACT OFFICE FOR ADDITIONAL REFILLS 90 capsule 0  . vitamin C (ASCORBIC ACID) 500 MG tablet Take 1,000 mg by mouth daily.     . nitroGLYCERIN (NITROSTAT) 0.4 MG SL tablet Place 1 tablet (0.4 mg total) under the tongue every 5 (five) minutes as needed for chest pain. 25 tablet 3   No current facility-administered medications for this visit.    LABS/IMAGING: No results found for this or any previous visit (from the past 48 hour(s)). No results found.  VITALS: BP 120/78 mmHg  Pulse 63  Ht 5\' 8"  (1.727 m)  Wt 199 lb (90.266 kg)  BMI 30.26 kg/m2  EXAM: General appearance: alert and no distress Neck: no adenopathy, no carotid bruit, no JVD, supple, symmetrical, trachea midline and thyroid not enlarged, symmetric, no tenderness/mass/nodules Lungs: clear to auscultation bilaterally Heart: regular rate and rhythm, S1, S2 normal, no murmur, click, rub or gallop Abdomen: soft, non-tender; bowel sounds normal; no masses,  no organomegaly Extremities: extremities normal, atraumatic, no cyanosis or edema Pulses: 2+ and symmetric Skin: Skin color, texture, turgor normal. No rashes or lesions Neurologic: Grossly normal  EKG: Normal sinus rhythm at 63, no ischemia  ASSESSMENT: 1. Coronary artery disease with a 60% mid LAD lesion in 2010 2. Dyslipidemia - at goal 3. Hypertension - controlled  PLAN: 1.   Tyler Acevedo has described a few episodes of chest discomfort but they  seem to be associated with becoming upset. He is under a lot of stress which is self-reported and apparently is having some problems with mood. His blood pressure is well-controlled. His cholesterol has been at goal but is due for reassessment. We'll go ahead and check that today. Despite his coronary disease seen in 2010, multiple stress tests of failed to show any significant ischemia and his symptoms are not typical at this point. I've encouraged him to continue to watch his see if he has any exercise-induced chest discomfort. We may need to consider further testing or repeat cardiac catheterization if that's the case. The time being he is still able to operate a motor vehicle from a cardiac standpoint and I signed off on DOT paperwork today stating that.  Chrystie Nose, MD, [facility] Attending Cardiologist [facility]   Lisette Abu  Hilty 09/17/2015, 5:54 PM

## 2015-09-17 NOTE — Patient Instructions (Signed)
Your physician has recommended you make the following change in your medication...  1. Dr. Rennis GoldenHilty has prescribed nitroglycerin to use as needed for chest pain   Your physician recommends that you return for lab work fasting to check cholesterol   Your physician wants you to follow-up in: 1 year with Dr. Rennis GoldenHilty. You will receive a reminder letter in the mail two months in advance. If you don't receive a letter, please call our office to schedule the follow-up appointment.

## 2015-11-10 ENCOUNTER — Other Ambulatory Visit: Payer: Self-pay | Admitting: Internal Medicine

## 2015-11-10 NOTE — Telephone Encounter (Signed)
REFILL 

## 2015-11-18 ENCOUNTER — Other Ambulatory Visit: Payer: Self-pay

## 2015-11-18 MED ORDER — VENLAFAXINE HCL ER 37.5 MG PO CP24: 37.5000 mg | ORAL_CAPSULE | Freq: Every day | ORAL | 2 refills | Status: DC

## 2015-11-20 ENCOUNTER — Other Ambulatory Visit: Payer: Self-pay

## 2015-11-20 MED ORDER — METOPROLOL SUCCINATE ER 25 MG PO TB24: 25.0000 mg | ORAL_TABLET | Freq: Every day | ORAL | 3 refills | Status: DC

## 2015-11-20 MED ORDER — CLOPIDOGREL BISULFATE 75 MG PO TABS: 75.0000 mg | ORAL_TABLET | Freq: Every day | ORAL | 3 refills | Status: DC

## 2015-11-20 MED ORDER — VENLAFAXINE HCL ER 37.5 MG PO CP24: 37.5000 mg | ORAL_CAPSULE | Freq: Every day | ORAL | 3 refills | Status: DC

## 2015-11-20 MED ORDER — ATORVASTATIN CALCIUM 40 MG PO TABS: 40.0000 mg | ORAL_TABLET | Freq: Every day | ORAL | 3 refills | Status: DC

## 2016-01-21 IMAGING — NM NM MISC PROCEDURE
6 series · 36 of 36 positions shown · non-contrast
Comparison: none

[Series 1: wbr_r-proj_st wbr rest · 6.40mm/px · 6 of 64 frames shown]
[frame 6/64]
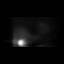
[frame 16/64]
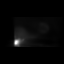
[frame 27/64]
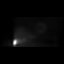
[frame 38/64]
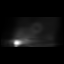
[frame 48/64]
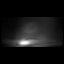
[frame 59/64]
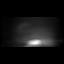

[Series 1: wbr rest · 6.40mm/px · 6 of 64 frames shown]
[frame 6/64]
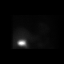
[frame 16/64]
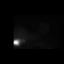
[frame 27/64]
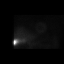
[frame 38/64]
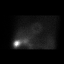
[frame 48/64]
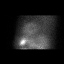
[frame 59/64]
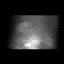

[Series 2: wbr stress-gsp · 6.40mm/px · 6 of 505 frames shown]
[frame 43/505]
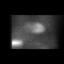
[frame 127/505]
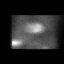
[frame 211/505]
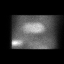
[frame 295/505]
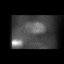
[frame 379/505]
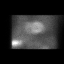
[frame 463/505]
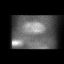

[Series 2: wbr_s-proj_st wbr stress-gsp · 6.40mm/px · 6 of 512 frames shown]
[frame 43/512]
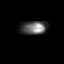
[frame 128/512]
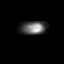
[frame 214/512]
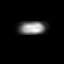
[frame 299/512]
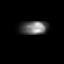
[frame 384/512]
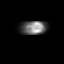
[frame 470/512]
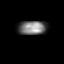

[Series 3: wbr stress-sum-em · 6.40mm/px · 6 of 64 frames shown]
[frame 6/64]
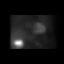
[frame 16/64]
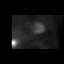
[frame 27/64]
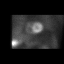
[frame 38/64]
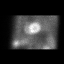
[frame 48/64]
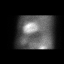
[frame 59/64]
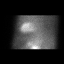

[Series 3: wbr_s-proj_st wbr stress-sum-em · 6.40mm/px · 6 of 64 frames shown]
[frame 6/64]
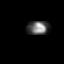
[frame 16/64]
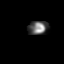
[frame 27/64]
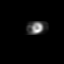
[frame 38/64]
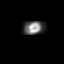
[frame 48/64]
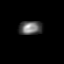
[frame 59/64]
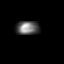

[36 of 36 positions shown; findings below may reference images not displayed]

Canned report from images found in remote index.

Refer to host system for actual result text.

## 2016-09-12 ENCOUNTER — Other Ambulatory Visit: Payer: Self-pay | Admitting: Internal Medicine

## 2016-09-15 ENCOUNTER — Ambulatory Visit (INDEPENDENT_AMBULATORY_CARE_PROVIDER_SITE_OTHER): Payer: 59 | Admitting: Internal Medicine

## 2016-09-15 ENCOUNTER — Encounter (INDEPENDENT_AMBULATORY_CARE_PROVIDER_SITE_OTHER): Payer: Self-pay

## 2016-09-15 ENCOUNTER — Encounter: Payer: Self-pay | Admitting: Internal Medicine

## 2016-09-15 VITALS — BP 126/82 | HR 60 | Ht 68.0 in | Wt 198.2 lb

## 2016-09-15 DIAGNOSIS — I2583 Coronary atherosclerosis due to lipid rich plaque: Secondary | ICD-10-CM | POA: Diagnosis not present

## 2016-09-15 DIAGNOSIS — Z79899 Other long term (current) drug therapy: Secondary | ICD-10-CM

## 2016-09-15 DIAGNOSIS — Z125 Encounter for screening for malignant neoplasm of prostate: Secondary | ICD-10-CM

## 2016-09-15 DIAGNOSIS — E785 Hyperlipidemia, unspecified: Secondary | ICD-10-CM | POA: Diagnosis not present

## 2016-09-15 DIAGNOSIS — I2510 Atherosclerotic heart disease of native coronary artery without angina pectoris: Secondary | ICD-10-CM | POA: Diagnosis not present

## 2016-09-15 DIAGNOSIS — Z0289 Encounter for other administrative examinations: Secondary | ICD-10-CM | POA: Diagnosis not present

## 2016-09-15 LAB — LIPID PANEL
Chol/HDL Ratio: 2.2 ratio (ref 0.0–5.0)
Cholesterol, Total: 113 mg/dL (ref 100–199)
HDL: 51 mg/dL (ref >39)
LDL Calculated: 44 mg/dL (ref 0–99)
TRIGLYCERIDES: 92 mg/dL (ref 0–149)
VLDL Cholesterol Cal: 18 mg/dL (ref 5–40)

## 2016-09-15 LAB — COMPREHENSIVE METABOLIC PANEL
ALT: 21 IU/L (ref 0–44)
AST: 24 IU/L (ref 0–40)
Albumin/Globulin Ratio: 2.3 — ABNORMAL HIGH (ref 1.2–2.2)
Albumin: 4.6 g/dL (ref 3.5–5.5)
Alkaline Phosphatase: 44 IU/L (ref 39–117)
BUN/Creatinine Ratio: 12 (ref 9–20)
BUN: 12 mg/dL (ref 6–24)
Bilirubin Total: 1 mg/dL (ref 0.0–1.2)
CALCIUM: 9.5 mg/dL (ref 8.7–10.2)
CO2: 25 mmol/L (ref 18–29)
CREATININE: 1.02 mg/dL (ref 0.76–1.27)
Chloride: 101 mmol/L (ref 96–106)
GFR calc Af Amer: 100 mL/min/{1.73_m2} (ref >59)
GFR, EST NON AFRICAN AMERICAN: 87 mL/min/{1.73_m2} (ref >59)
Globulin, Total: 2 g/dL (ref 1.5–4.5)
Glucose: 99 mg/dL (ref 65–99)
Potassium: 4.4 mmol/L (ref 3.5–5.2)
Sodium: 141 mmol/L (ref 134–144)
Total Protein: 6.6 g/dL (ref 6.0–8.5)

## 2016-09-15 LAB — PSA: Prostate Specific Ag, Serum: 0.7 ng/mL (ref 0.0–4.0)

## 2016-09-15 NOTE — Progress Notes (Signed)
OFFICE NOTE  Chief Complaint:  No complaints  Primary Care Physician: Hortense Ramal, MD  HPI:  Bretton Tandy.  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 He had a 60% mid LAD lesion which was negative by FFR and not significant by IVUS evaluation. He did have soft plaque on IVUS, and it was recommended that he undergo intensive medical therapy, which he is currently on. 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 There has been no clear indication for repeat stress testing, and overall again he is doing well.  Mr. Guterrez has no new complaints. 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 He has maintained on Ranexa, however I wonder what the necessity is at this point. He's had marked improvement in his cholesterol profile, in fact most recently his lipid profile showed particle number of 333, and LDL of 28. Based on this I decreased his Lipitor back to 40 mg daily. We have also had discussions about possibly coming off of Ranexa which she's been hesitant to do in the past however I feel that he can do this and see what his symptoms are. Should he have more angina. He would need to go back on the medication  I saw Mr. Dunavan back in the office today. He has no complaints. He denies any chest pain or shortness of breath. He remains physically active. He is due for repeat lipid profile. He is scheduled to have a stress test today for DOT purposes. Stress test to this point have been negative for ischemia, in spite of a 60% LAD lesions which was not flow limiting.  09/17/2015  Mr. Mahr returns today for follow-up. Recently he's had a lot of trouble apparently with stress in his life including  problems with work and seemed to be not his usual self today. His wife has been concerned about him but he does not seem to be manifesting any coronary symptoms. He did have an episode where he had some stomach pain and was followed by chest discomfort and low-grade temperature. He did not have nausea or diarrhea was thought to possibly have flulike symptoms including achy joints. He also reports some occasional discomfort in his chest when he gets very upset but that seems to resolve when he rests. On the other hand, he is able to exercise including doing work on treadmill and walking at a fast pace with his wife up and down hills without any chest discomfort or limitations. He's been working on restoring a pontoon boat and doing heavy lifting without any limitations.  09/15/2016  Mr. Jeannett Senior was seen today in follow-up. He is an exceedingly well over the past year. He has managed to lose about 15 pounds but gained some of it back. Blood pressure is well-controlled at 126/82. EKG shows sinus rhythm. He denies any chest pain or worsening shortness of breath. His cholesterol has been very well controlled but is due for reassessment. 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  PMHx:  Past Medical History:  Diagnosis Date  . CAD (coronary artery disease)   . History of nuclear stress test 06/09/2009   exericse myoview;  post stress EF 69%; evidence of mild ischemia in basal anteroseptal region  . Hypertension     Past Surgical History:  Procedure Laterality Date  . CARDIAC CATHETERIZATION  2010   LA has ostial 10% stenosis, mid LAD hazy 30-40% stenosis  . HERNIA REPAIR  2006  . KNEE SURGERY  1987  . NOSE SURGERY  1985  . WRIST SURGERY  1982    FAMHx:  Family History  Problem Relation Age of Onset  . Heart attack Father   . Hypertension Father   . Other Paternal Grandmother        Parkinson's     SOCHx:   reports that he has never smoked. He has never used smokeless tobacco. He reports that he does not drink alcohol or use drugs.  ALLERGIES:  No Known Allergies  ROS: A comprehensive review of systems was negative.  HOME MEDS: Current Outpatient Prescriptions  Medication Sig Dispense Refill  . aspirin 81 MG tablet Take 81 mg by mouth daily.    Marland Kitchen atorvastatin (LIPITOR) 40 MG tablet TAKE 1 TABLET BY MOUTH  DAILY AT 6 PM 90 tablet 0  . clopidogrel (PLAVIX) 75 MG tablet TAKE 1 TABLET BY MOUTH  DAILY 90 tablet 0  . metoprolol succinate (TOPROL-XL) 25 MG 24 hr tablet TAKE 1 TABLET BY MOUTH  DAILY 90 tablet 0  . nitroGLYCERIN (NITROSTAT) 0.4 MG SL tablet Dissolve 1 tablet under the tongue every 5 minutes as  needed for chest pain 100 tablet 5  . Omega-3 Fatty Acids (FISH OIL) 300 MG CAPS Take 2 capsules by mouth daily.    Marland Kitchen venlafaxine XR (EFFEXOR-XR) 37.5 MG 24 hr capsule Take 1 capsule (37.5 mg total) by mouth daily with breakfast. 90 capsule 3  . vitamin C (ASCORBIC ACID) 500 MG tablet Take 1,000 mg by mouth daily.      No current facility-administered medications for this visit.     LABS/IMAGING: No results found for this or any previous visit (from the past 48 hour(s)). No results found.  VITALS: BP 126/82   Pulse 60   Ht 5\' 8"  (1.727 m)   Wt 198 lb 3.2 oz (89.9 kg)   BMI 30.14 kg/m   EXAM: General appearance: alert and no distress Neck: no adenopathy, no carotid bruit, no JVD, supple, symmetrical, trachea midline and thyroid not enlarged, symmetric, no tenderness/mass/nodules Lungs: clear to auscultation bilaterally Heart: regular rate and rhythm, S1, S2 normal, no murmur, click, rub or gallop Abdomen: soft, non-tender; bowel sounds normal; no masses,  no organomegaly Extremities: extremities normal, atraumatic, no cyanosis or edema Pulses: 2+ and symmetric Skin: Skin color, texture, turgor normal. No rashes or lesions Neurologic: Grossly  normal  EKG: Normal sinus rhythm at 60  ASSESSMENT: 1. Coronary artery disease with a 60% mid LAD lesion in 2010 2. Dyslipidemia - at goal 3. Hypertension - controlled  PLAN: 1.   Mr. Goren continues to do well and is been asymptomatic without any clinical progression of his coronary artery disease. His cholesterol is been well controlled with LDL less than 70. We'll like to recheck that again today. Also check a metabolic profile and a PSA screening as he has no primary care provider. Also informed of the new guidelines indicate he should be considering a colonoscopy. The screening age without family history of colon cancer is now lower at age 6. Blood pressure is at goal today. I have no hesitation for him to continue to resume DOT-related driving duties.  Follow-up annually  or sooner as necessary.  Chrystie NoseKenneth C. Ramesh Moan, MD, [facility] Attending Cardiologist [facility]   Lisette AbuKenneth C Divine Imber 09/15/2016, 11:10 AM

## 2016-09-15 NOTE — Patient Instructions (Signed)
Your physician recommends that you return for lab work TODAY   Your physician wants you to follow-up in: ONE YEAR with Dr. Hilty. You will receive a reminder letter in the mail two months in advance. If you don't receive a letter, please call our office to schedule the follow-up appointment.   

## 2016-09-16 ENCOUNTER — Encounter: Payer: Self-pay | Admitting: Internal Medicine

## 2017-01-06 ENCOUNTER — Other Ambulatory Visit: Payer: Self-pay

## 2017-01-06 ENCOUNTER — Other Ambulatory Visit: Payer: Self-pay | Admitting: Internal Medicine

## 2017-01-06 MED ORDER — VENLAFAXINE HCL ER 37.5 MG PO CP24: 37.5000 mg | ORAL_CAPSULE | Freq: Every day | ORAL | 3 refills | Status: DC

## 2017-01-11 ENCOUNTER — Other Ambulatory Visit: Payer: Self-pay | Admitting: *Deleted

## 2017-01-23 ENCOUNTER — Other Ambulatory Visit: Payer: Self-pay

## 2017-01-23 MED ORDER — VENLAFAXINE HCL ER 37.5 MG PO CP24: 37.5000 mg | ORAL_CAPSULE | Freq: Every day | ORAL | 3 refills | Status: DC

## 2017-01-25 ENCOUNTER — Other Ambulatory Visit: Payer: Self-pay | Admitting: Internal Medicine

## 2017-01-25 ENCOUNTER — Telehealth: Payer: Self-pay | Admitting: Internal Medicine

## 2017-01-25 MED ORDER — VENLAFAXINE HCL ER 37.5 MG PO CP24: 37.5000 mg | ORAL_CAPSULE | Freq: Every day | ORAL | 2 refills | Status: DC

## 2017-01-25 NOTE — Telephone Encounter (Signed)
New message    Pt is calling asking for a call back. He said it's about his medication

## 2017-01-25 NOTE — Telephone Encounter (Signed)
Returned call to patient no answer.LMTC. 

## 2017-01-25 NOTE — Telephone Encounter (Signed)
Received call back from patient.He stated he needed 90 day refill for Effexor sent to mail order pharmacy.Advised refill already sent.

## 2017-08-17 ENCOUNTER — Encounter: Payer: Self-pay | Admitting: Internal Medicine

## 2017-08-17 ENCOUNTER — Ambulatory Visit: Payer: 59 | Admitting: Internal Medicine

## 2017-08-17 VITALS — BP 122/78 | HR 59 | Ht 68.0 in | Wt 206.0 lb

## 2017-08-17 DIAGNOSIS — E785 Hyperlipidemia, unspecified: Secondary | ICD-10-CM

## 2017-08-17 DIAGNOSIS — I10 Essential (primary) hypertension: Secondary | ICD-10-CM | POA: Diagnosis not present

## 2017-08-17 DIAGNOSIS — I2583 Coronary atherosclerosis due to lipid rich plaque: Secondary | ICD-10-CM | POA: Diagnosis not present

## 2017-08-17 DIAGNOSIS — I2510 Atherosclerotic heart disease of native coronary artery without angina pectoris: Secondary | ICD-10-CM | POA: Diagnosis not present

## 2017-08-17 LAB — COMPREHENSIVE METABOLIC PANEL WITH GFR
ALT: 19 IU/L (ref 0–44)
AST: 21 IU/L (ref 0–40)
Albumin/Globulin Ratio: 2.7 — ABNORMAL HIGH (ref 1.2–2.2)
Albumin: 4.8 g/dL (ref 3.5–5.5)
Alkaline Phosphatase: 49 IU/L (ref 39–117)
BUN/Creatinine Ratio: 13 (ref 9–20)
BUN: 13 mg/dL (ref 6–24)
Bilirubin Total: 0.7 mg/dL (ref 0.0–1.2)
CO2: 27 mmol/L (ref 20–29)
Calcium: 9.4 mg/dL (ref 8.7–10.2)
Chloride: 101 mmol/L (ref 96–106)
Creatinine, Ser: 1.02 mg/dL (ref 0.76–1.27)
GFR calc Af Amer: 99 mL/min/1.73
GFR calc non Af Amer: 86 mL/min/1.73
Globulin, Total: 1.8 g/dL (ref 1.5–4.5)
Glucose: 97 mg/dL (ref 65–99)
Potassium: 5.1 mmol/L (ref 3.5–5.2)
Sodium: 141 mmol/L (ref 134–144)
Total Protein: 6.6 g/dL (ref 6.0–8.5)

## 2017-08-17 LAB — LIPID PANEL
CHOLESTEROL TOTAL: 119 mg/dL (ref 100–199)
Chol/HDL Ratio: 2.1 ratio (ref 0.0–5.0)
HDL: 56 mg/dL (ref >39)
LDL Calculated: 47 mg/dL (ref 0–99)
Triglycerides: 82 mg/dL (ref 0–149)
VLDL CHOLESTEROL CAL: 16 mg/dL (ref 5–40)

## 2017-08-17 NOTE — Progress Notes (Signed)
OFFICE NOTE  Chief Complaint:  No complaints  Primary Care Physician: Hortense Ramal, MD  HPI:  Tyler Dax.  is a 52 year old gentleman who we are following for annual DOT physicals and who is here for a history of coronary artery disease by cardiac catheterization in 2010. He had a 60% mid LAD lesion which was negative by FFR and not significant by IVUS evaluation. He did have soft plaque on IVUS, and it was recommended that he undergo intensive medical therapy, which he is currently on. 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 There has been no clear indication for repeat stress testing, and overall again he is doing well.  Tyler Acevedo has no new complaints. 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 He has maintained on Ranexa, however I wonder what the necessity is at this point. He's had marked improvement in his cholesterol profile, in fact most recently his lipid profile showed particle number of 333, and LDL of 28. Based on this I decreased his Lipitor back to 40 mg daily. We have also had discussions about possibly coming off of Ranexa which she's been hesitant to do in the past however I feel that he can do this and see what his symptoms are. Should he have more angina. He would need to go back on the medication  I saw Tyler Acevedo back in the office today. He has no complaints. He denies any chest pain or shortness of breath. He remains physically active. He is due for repeat lipid profile. He is scheduled to have a stress test today for DOT purposes. Stress test to this point have been negative for ischemia, in spite of a 60% LAD lesions which was not flow limiting.  09/17/2015  Tyler Acevedo returns today for follow-up. Recently he's had a lot of trouble apparently with stress in his life including problems  with work and seemed to be not his usual self today. His wife has been concerned about him but he does not seem to be manifesting any coronary symptoms. He did have an episode where he had some stomach pain and was followed by chest discomfort and low-grade temperature. He did not have nausea or diarrhea was thought to possibly have flulike symptoms including achy joints. He also reports some occasional discomfort in his chest when he gets very upset but that seems to resolve when he rests. On the other hand, he is able to exercise including doing work on treadmill and walking at a fast pace with his wife up and down hills without any chest discomfort or limitations. He's been working on restoring a pontoon boat and doing heavy lifting without any limitations.  09/15/2016  Tyler Acevedo was seen today in follow-up. He is an exceedingly well over the past year. He has managed to lose about 15 pounds but gained some of it back. Blood pressure is well-controlled at 126/82. EKG shows sinus rhythm. He denies any chest pain or worsening shortness of breath. His cholesterol has been very well controlled but is due for reassessment. 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  08/17/2017  Tyler Acevedo returns today for follow-up.  He continues to do well.  Weight is up from 198-206.  He says is been less active.  He does have a left shoulder injury may need rotator cuff surgery.  He denies chest pain or shortness of breath.  Blood pressure is at goal today.  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  He is now 9 years out of his coronary artery disease which was moderate in the LAD.  We discussed again whether or not he needs to continue on Plavix.  He remains asymptomatic.  He is at increased bleeding risk.  After some discussion he is agreeable to stop it.  Should he have recurrent chest  pain, we will need to consider repeat coronary evaluation.  I also felt that he could discontinue his fish oil as is little additional benefit on top of his statin.  He is due for lab work today.  PMHx:  Past Medical History:  Diagnosis Date  . CAD (coronary artery disease)   . History of nuclear stress test 06/09/2009   exericse myoview; post stress EF 69%; evidence of mild ischemia in basal anteroseptal region  . Hypertension     Past Surgical History:  Procedure Laterality Date  . CARDIAC CATHETERIZATION  2010   LA has ostial 10% stenosis, mid LAD hazy 30-40% stenosis  . HERNIA REPAIR  2006  . KNEE SURGERY  1987  . NOSE SURGERY  1985  . WRIST SURGERY  1982    FAMHx:  Family History  Problem Relation Age of Onset  . Heart attack Father   . Hypertension Father   . Other Paternal Grandmother        Parkinson's    SOCHx:   reports that he has never smoked. He has never used smokeless tobacco. He reports that he does not drink alcohol or use drugs.  ALLERGIES:  No Known Allergies  ROS: A comprehensive review of systems was negative.  HOME MEDS: Current Outpatient Medications  Medication Sig Dispense Refill  . aspirin 81 MG tablet Take 81 mg by mouth daily.    Marland Kitchen atorvastatin (LIPITOR) 40 MG tablet TAKE 1 TABLET BY MOUTH  DAILY AT 6 PM 90 tablet 3  . clopidogrel (PLAVIX) 75 MG tablet TAKE 1 TABLET BY MOUTH  DAILY 90 tablet 3  . metoprolol succinate (TOPROL-XL) 25 MG 24 hr tablet TAKE 1 TABLET BY MOUTH  DAILY 90 tablet 3  . nitroGLYCERIN (NITROSTAT) 0.4 MG SL tablet Dissolve 1 tablet under the tongue every 5 minutes as  needed for chest pain 100 tablet 5  . Omega-3 Fatty Acids (FISH OIL) 300 MG CAPS Take 2 capsules by mouth daily.    Marland Kitchen venlafaxine XR (EFFEXOR-XR) 37.5 MG 24 hr capsule Take 1 capsule (37.5 mg total) by mouth daily with breakfast. 90 capsule 2  . vitamin C (ASCORBIC ACID) 500 MG tablet Take 1,000 mg by mouth daily.      No current facility-administered  medications for this visit.     LABS/IMAGING: No results found for this or any previous visit (from the past 48 hour(s)). No results found.  VITALS: BP 122/78   Pulse (!) 59   Ht  (1.727 m)   Wt 206 lb (93.4 kg)   BMI 31.32 kg/m   EXAM: General appearance: alert and no distress Neck: no adenopathy, no carotid bruit, no JVD, supple, symmetrical, trachea midline and thyroid not enlarged, symmetric, no tenderness/mass/nodules Lungs: clear to auscultation bilaterally Heart: regular rate and rhythm, S1, S2 normal, no murmur, click, rub or gallop Abdomen: soft, non-tender; bowel sounds normal;  no masses,  no organomegaly Extremities: extremities normal, atraumatic, no cyanosis or edema Pulses: 2+ and symmetric Skin: Skin color, texture, turgor normal. No rashes or lesions Neurologic: Grossly normal  EKG: Sinus bradycardia 59-personally reviewed  ASSESSMENT: 1. Coronary artery disease with a 60% mid LAD lesion in 2010 2. Dyslipidemia - at goal 3. Hypertension - controlled  PLAN: 1.   Tyler Acevedo had moderate obstruction of the mid LAD in 2010 but has been asymptomatic since then.  He has been on dual antiplatelet therapy, but bruises easily and has had increased bleeding risk.  I think he could probably do well enough on just aspirin alone.  His cholesterol is been adequately controlled and blood pressure is at goal.  He will go ahead and stop the Plavix but should report to [place] if he has any development of chest pain.  In addition there is little benefit from over-the-counter fish oil in addition to his statin and he will discontinue this as well.  We will repeat labs today including metabolic profile lipid profile.  Follow-up with me annually or sooner as necessary.  Chrystie Nose, MD, [facility], FACP  [place]  [facility]  Medical Director of the Advanced Lipid Disorders &  Cardiovascular Risk Reduction Clinic Diplomate of the American Board of Clinical  Lipidology Attending Cardiologist  Direct Dial: [phone]  Fax: [phone]  Website:  [email or web address]  Lisette Abu Hilty 08/17/2017, 8:21 AM

## 2017-08-17 NOTE — Patient Instructions (Signed)
Medication Instructions: STOP the Plavix STOP the Fish Oil  If you need a refill on your cardiac medications before your next appointment, please call your pharmacy.   Labwork: Your provider would like for you to have the following labs today FASTING lipid and CMET   Follow-Up: Your physician wants you to follow-up in 12 months with Dr. Georgeann Oppenheim will receive a reminder letter in the mail two months in advance. If you don't receive a letter, please call our office at [phone] to schedule this follow-up appointment.   Thank you for choosing [facility]!!

## 2017-08-22 ENCOUNTER — Encounter: Payer: Self-pay | Admitting: Internal Medicine

## 2017-08-25 ENCOUNTER — Ambulatory Visit: Payer: 59 | Admitting: Internal Medicine

## 2017-08-30 ENCOUNTER — Ambulatory Visit: Payer: 59 | Admitting: Internal Medicine

## 2017-11-12 ENCOUNTER — Other Ambulatory Visit: Payer: Self-pay | Admitting: Internal Medicine

## 2018-01-20 ENCOUNTER — Other Ambulatory Visit: Payer: Self-pay | Admitting: Internal Medicine

## 2018-08-01 ENCOUNTER — Telehealth: Payer: Self-pay

## 2018-08-01 NOTE — Telephone Encounter (Signed)
Patient insisted on being seen in person. He needs clearance for his job and his form is due by 08/18/18. Patient preferred to see Dr Rennis Golden. Rescheduled patient's appointment for 08/13/18 at 8:00a. Patient verbalized understanding and agreed with plan.

## 2018-08-02 ENCOUNTER — Telehealth: Payer: Self-pay | Admitting: Internal Medicine

## 2018-08-02 NOTE — Telephone Encounter (Signed)
This is for you!

## 2018-08-02 NOTE — Telephone Encounter (Signed)
LVM for pre reg x3 °

## 2018-08-02 NOTE — Telephone Encounter (Signed)
LVM for pre reg °

## 2018-08-02 NOTE — Telephone Encounter (Signed)
Called patient he spoke with Chelley yesterday regarding his medical clearance forms. He asked if Dr.Hilty can go ahead and fill those out, they are for his DOT. Advised that patient has appointment 08/13/2018 to come in and see Dr.Hilty. Advised I would route a message back to see about this.  Patient verbalized understanding.

## 2018-08-02 NOTE — Telephone Encounter (Signed)
Appointment rescheduled for virtual visit on 4/17 at 930 am with Dr. Rennis Golden. Attempted to contact pt to set up mychart. Left message to call back.

## 2018-08-02 NOTE — Telephone Encounter (Signed)
Spoke with pt. He voiced he was able to set up mychart.

## 2018-08-02 NOTE — Telephone Encounter (Signed)
New Message     Patient states nurse was suppose to call him and he's still waiting on the call.

## 2018-08-02 NOTE — Telephone Encounter (Signed)
Called Tyler Acevedo today- his in-office visit for DOT physical is not urgent (4/27) and not appropriate for in-office given the current pandemic. He agrees with this and is agreeable to an eVisit. Please schedule him for a video visit with me tomorrow, 4/17 at 9:30 am. Cancel the 4/27 in office visit. Also, he needs to get paperwork from the DOT for me to fill out and return. Can we get him signed up for MyChart to upload the paperwork or can he scan and email it?  Thanks.  Dr. Rennis Golden

## 2018-08-02 NOTE — Telephone Encounter (Signed)
Follow up  ° ° °Pt is returning call  ° ° °Please call back  °

## 2018-08-03 ENCOUNTER — Telehealth (INDEPENDENT_AMBULATORY_CARE_PROVIDER_SITE_OTHER): Payer: 59 | Admitting: Internal Medicine

## 2018-08-03 ENCOUNTER — Ambulatory Visit: Payer: 59 | Admitting: Internal Medicine

## 2018-08-03 ENCOUNTER — Encounter: Payer: Self-pay | Admitting: Internal Medicine

## 2018-08-03 VITALS — BP 119/78 | HR 64 | Ht 68.0 in | Wt 202.0 lb

## 2018-08-03 DIAGNOSIS — Z0289 Encounter for other administrative examinations: Secondary | ICD-10-CM | POA: Diagnosis not present

## 2018-08-03 DIAGNOSIS — E782 Mixed hyperlipidemia: Secondary | ICD-10-CM

## 2018-08-03 DIAGNOSIS — I2510 Atherosclerotic heart disease of native coronary artery without angina pectoris: Secondary | ICD-10-CM

## 2018-08-03 DIAGNOSIS — E785 Hyperlipidemia, unspecified: Secondary | ICD-10-CM | POA: Diagnosis not present

## 2018-08-03 DIAGNOSIS — I2583 Coronary atherosclerosis due to lipid rich plaque: Secondary | ICD-10-CM

## 2018-08-03 DIAGNOSIS — I10 Essential (primary) hypertension: Secondary | ICD-10-CM

## 2018-08-03 DIAGNOSIS — Z7189 Other specified counseling: Secondary | ICD-10-CM | POA: Diagnosis not present

## 2018-08-03 NOTE — Patient Instructions (Signed)
Medication Instructions:  Continue same medications If you need a refill on your cardiac medications before your next appointment, please call your pharmacy.   Lab work: Cmet,lipid panel   have done next week    Lab slip enclosed If you have labs (blood work) drawn today and your tests are completely normal, you will receive your results only by: Marland Kitchen MyChart Message (if you have MyChart) OR . A paper copy in the mail If you have any lab test that is abnormal or we need to change your treatment, we will call you to review the results.  Testing/Procedures: None ordered  Follow-Up: At [facility], you and your health needs are our priority.  As part of our continuing mission to provide you with exceptional heart care, we have created designated Provider Care Teams.  These Care Teams include your primary Cardiologist (physician) and Advanced Practice Providers (APPs -  Physician Assistants and Nurse Practitioners) who all work together to provide you with the care you need, when you need it. . Schedule follow up appointment with Dr.Hilty in 1 year   Call 3 months before to schedule.

## 2018-08-03 NOTE — Progress Notes (Signed)
Virtual Visit via Video Note   This visit type was conducted due to national recommendations for restrictions regarding the COVID-19 Pandemic (e.g. social distancing) in an effort to limit this patient's exposure and mitigate transmission in our community.  Due to his co-morbid illnesses, this patient is at least at moderate risk for complications without adequate follow up.  This format is felt to be most appropriate for this patient at this time.  All issues noted in this document were discussed and addressed.  A limited physical exam was performed with this format.  Please refer to the patient's chart for his consent to telehealth for [facility].   Evaluation Performed:  Video follow-up  Date:  08/03/2018   ID:  Tyler AngJoseph D Crail Jr., DOB 1965-09-11, MRN [id number]  Patient Location:  [address] [place] [address]  Provider location:   [address], [address] [place], [place] [address]  PCP:  Hortense Ramalegmi, Suman, MD  Cardiologist:  No primary care provider on file. Electrophysiologist:  None   Chief Complaint:  No complaints  History of Present Illness:    Tyler AngJoseph D Stober Jr. is a 53 y.o. male who presents via audio/video conferencing for a telehealth visit today.  Mr. Tyler Acevedo was seen today in a video visit.  This is a routine follow-up.  Overall he continues to do well.  He has a history of coronary artery disease with a 60% LAD lesion in 2010.  He has been aggressively managed with statin, beta-blocker and aspirin and has done well without new symptoms.  He denies any chest pain or worsening shortness of breath.  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  Blood pressure was well controlled today 119/78.  His last lipid profile was 11 months ago and showed a total cholesterol 119, triglycerides 82, HDL 56 and LDL 47.  Is requesting clearance for DOT physical today.  The patient does not have symptoms concerning for  COVID-19 infection (fever, chills, cough, or new SHORTNESS OF BREATH).    Prior CV studies:   The following studies were reviewed today:  Lipid profile  PMHx:  Past Medical History:  Diagnosis Date  . CAD (coronary artery disease)   . History of nuclear stress test 06/09/2009   exericse myoview; post stress EF 69%; evidence of mild ischemia in basal anteroseptal region  . Hypertension     Past Surgical History:  Procedure Laterality Date  . CARDIAC CATHETERIZATION  2010   LA has ostial 10% stenosis, mid LAD hazy 30-40% stenosis  . HERNIA REPAIR  2006  . KNEE SURGERY  1987  . NOSE SURGERY  1985  . WRIST SURGERY  1982    FAMHx:  Family History  Problem Relation Age of Onset  . Heart attack Father   . Hypertension Father   . Other Paternal Grandmother        Parkinson's    SOCHx:   reports that he has never smoked. He has never used smokeless tobacco. He reports that he does not drink alcohol or use drugs.  ALLERGIES:  No Known Allergies  MEDS:  Current Meds  Medication Sig  . aspirin 81 MG tablet Take 81 mg by mouth daily.  Marland Kitchen. atorvastatin (LIPITOR) 40 MG tablet TAKE 1 TABLET BY MOUTH  DAILY AT 6 PM  . metoprolol succinate (TOPROL-XL) 25 MG 24 hr tablet TAKE 1 TABLET BY MOUTH  DAILY  . nitroGLYCERIN (NITROSTAT) 0.4 MG  SL tablet Dissolve 1 tablet under the tongue every 5 minutes as  needed for chest pain  . Omega-3 Fatty Acids (OMEGA-3 FISH OIL) 1200 MG CAPS Take 1 capsule by mouth. Take 1200 mg twice a day  . venlafaxine XR (EFFEXOR-XR) 37.5 MG 24 hr capsule TAKE 1 CAPSULE BY MOUTH  DAILY WITH BREAKFAST  . vitamin C (ASCORBIC ACID) 500 MG tablet Take 1,000 mg by mouth daily.      ROS: Pertinent items noted in HPI and remainder of comprehensive ROS otherwise negative.  Labs/Other Tests and Data Reviewed:    Recent Labs: 08/17/2017: ALT 19; BUN 13; Creatinine, Ser 1.02; Potassium 5.1; Sodium 141   Recent Lipid Panel Lab Results  Component Value Date/Time    CHOL 119 08/17/2017 08:44 AM   CHOL 110 09/19/2014 01:35 PM   TRIG 82 08/17/2017 08:44 AM   TRIG 131 09/19/2014 01:35 PM   HDL 56 08/17/2017 08:44 AM   HDL 44 09/19/2014 01:35 PM   CHOLHDL 2.1 08/17/2017 08:44 AM   CHOLHDL 4.3 04/30/2008 04:47 AM   LDLCALC 47 08/17/2017 08:44 AM   LDLCALC 40 09/19/2014 01:35 PM    Wt Readings from Last 3 Encounters:  08/03/18 202 lb (91.6 kg)  08/17/17 206 lb (93.4 kg)  09/15/16 198 lb 3.2 oz (89.9 kg)     Exam:    Vital Signs:  BP 119/78   Pulse 64   Ht  (1.727 m)   Wt 202 lb (91.6 kg)   BMI 30.71 kg/m    General appearance: alert and no distress Lungs: No audible respiratory difficulty Extremities: extremities normal, atraumatic, no cyanosis or edema Skin: Skin color, texture, turgor normal. No rashes or lesions Neurologic: Grossly normal Psych: Pleasant  ASSESSMENT & PLAN:    1. Coronary artery disease with a 60% mid LAD lesion in 2010 2. Dyslipidemia - at goal 3. Hypertension - controlled  Mr. Tyler Acevedo continues to do well.  He denies any new chest pain or worsening shortness of breath.  His cholesterol is well controlled with LDL below 70.  His blood pressure is at goal as well.  He continues to be physically active with no symptoms.  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  I filled out a medical clearance form and send it to him today via my chart.  He is due for repeat metabolic profile and lipid profile which she will get in the next couple of weeks.  Plan follow-up otherwise with me in 1 year.  COVID-19 Education: The signs and symptoms of COVID-19 were discussed with the patient and how to seek care for testing (follow up with PCP or arrange E-visit).  The importance of social distancing was discussed today.  Patient Risk:   After full review of this patients clinical status, I feel that they are at least moderate risk at this time.  Time:   Today, I have spent  25 minutes with the patient with telehealth technology discussing lipid profile, blood pressure, coronary artery disease, DOT driving requirements.     Medication Adjustments/Labs and Tests Ordered: Current medicines are reviewed at length with the patient today.  Concerns regarding medicines are outlined above.   Tests Ordered: Orders Placed This Encounter  Procedures  . Comprehensive Metabolic Panel (CMET)  . Lipid panel    Medication Changes: No orders of the defined types were placed in this encounter.   Disposition:  in 1 year(s)  Iantha Fallen  C. Rennis Golden, MD, [facility], FACP    [facility]  Medical Director of the Advanced Lipid Disorders &  Cardiovascular Risk Reduction Clinic Diplomate of the American Board of Clinical Lipidology Attending Cardiologist  Direct Dial: [phone]  Fax: [phone]  Website:  [email or web address]  Chrystie Nose, MD  08/03/2018 9:04 AM

## 2018-08-13 ENCOUNTER — Ambulatory Visit: Payer: 59 | Admitting: Internal Medicine

## 2018-08-14 LAB — COMPREHENSIVE METABOLIC PANEL
ALT: 21 IU/L (ref 0–44)
AST: 22 IU/L (ref 0–40)
Albumin/Globulin Ratio: 2.5 — ABNORMAL HIGH (ref 1.2–2.2)
Albumin: 4.8 g/dL (ref 4.0–5.0)
Alkaline Phosphatase: 56 IU/L (ref 39–117)
BUN/Creatinine Ratio: 15 (ref 9–20)
BUN: 19 mg/dL (ref 6–24)
Bilirubin Total: 0.7 mg/dL (ref 0.0–1.2)
CO2: 26 mmol/L (ref 20–29)
Calcium: 9.5 mg/dL (ref 8.7–10.2)
Chloride: 102 mmol/L (ref 96–106)
Creatinine, Ser: 1.29 mg/dL — ABNORMAL HIGH (ref 0.76–1.27)
GFR calc Af Amer: 74 mL/min/{1.73_m2} (ref >59)
GFR calc non Af Amer: 64 mL/min/{1.73_m2} (ref >59)
Globulin, Total: 1.9 g/dL (ref 1.5–4.5)
Glucose: 99 mg/dL (ref 65–99)
Potassium: 4.9 mmol/L (ref 3.5–5.2)
Sodium: 141 mmol/L (ref 134–144)
Total Protein: 6.7 g/dL (ref 6.0–8.5)

## 2018-08-14 LAB — LIPID PANEL
Chol/HDL Ratio: 2.4 ratio (ref 0.0–5.0)
Cholesterol, Total: 117 mg/dL (ref 100–199)
HDL: 49 mg/dL (ref >39)
LDL Calculated: 51 mg/dL (ref 0–99)
Triglycerides: 84 mg/dL (ref 0–149)
VLDL Cholesterol Cal: 17 mg/dL (ref 5–40)

## 2018-09-19 ENCOUNTER — Other Ambulatory Visit: Payer: Self-pay | Admitting: Internal Medicine

## 2018-12-26 ENCOUNTER — Other Ambulatory Visit: Payer: Self-pay | Admitting: Internal Medicine

## 2019-05-24 ENCOUNTER — Other Ambulatory Visit: Payer: Self-pay | Admitting: Internal Medicine

## 2019-07-11 ENCOUNTER — Ambulatory Visit: Payer: 59 | Admitting: Internal Medicine

## 2019-07-12 ENCOUNTER — Encounter: Payer: Self-pay | Admitting: Internal Medicine

## 2019-07-12 ENCOUNTER — Other Ambulatory Visit: Payer: Self-pay

## 2019-07-12 ENCOUNTER — Ambulatory Visit: Payer: 59 | Admitting: Internal Medicine

## 2019-07-12 VITALS — BP 108/68 | HR 75 | Temp 97.7°F | Ht 68.0 in | Wt 207.6 lb

## 2019-07-12 DIAGNOSIS — E785 Hyperlipidemia, unspecified: Secondary | ICD-10-CM

## 2019-07-12 DIAGNOSIS — I10 Essential (primary) hypertension: Secondary | ICD-10-CM

## 2019-07-12 DIAGNOSIS — I2510 Atherosclerotic heart disease of native coronary artery without angina pectoris: Secondary | ICD-10-CM | POA: Diagnosis not present

## 2019-07-12 DIAGNOSIS — I2583 Coronary atherosclerosis due to lipid rich plaque: Secondary | ICD-10-CM | POA: Diagnosis not present

## 2019-07-12 LAB — COMPREHENSIVE METABOLIC PANEL
ALT: 32 IU/L (ref 0–44)
AST: 34 IU/L (ref 0–40)
Albumin/Globulin Ratio: 2.2 (ref 1.2–2.2)
Albumin: 4.8 g/dL (ref 3.8–4.9)
Alkaline Phosphatase: 54 IU/L (ref 39–117)
BUN/Creatinine Ratio: 13 (ref 9–20)
BUN: 14 mg/dL (ref 6–24)
Bilirubin Total: 1 mg/dL (ref 0.0–1.2)
CO2: 24 mmol/L (ref 20–29)
Calcium: 9.5 mg/dL (ref 8.7–10.2)
Chloride: 100 mmol/L (ref 96–106)
Creatinine, Ser: 1.12 mg/dL (ref 0.76–1.27)
GFR calc Af Amer: 87 mL/min/{1.73_m2} (ref >59)
GFR calc non Af Amer: 76 mL/min/{1.73_m2} (ref >59)
Globulin, Total: 2.2 g/dL (ref 1.5–4.5)
Glucose: 100 mg/dL — ABNORMAL HIGH (ref 65–99)
Potassium: 5 mmol/L (ref 3.5–5.2)
Sodium: 139 mmol/L (ref 134–144)
Total Protein: 7 g/dL (ref 6.0–8.5)

## 2019-07-12 LAB — LIPID PANEL
Chol/HDL Ratio: 2.4 ratio (ref 0.0–5.0)
Cholesterol, Total: 118 mg/dL (ref 100–199)
HDL: 50 mg/dL (ref >39)
LDL Chol Calc (NIH): 48 mg/dL (ref 0–99)
Triglycerides: 113 mg/dL (ref 0–149)
VLDL Cholesterol Cal: 20 mg/dL (ref 5–40)

## 2019-07-12 NOTE — Progress Notes (Signed)
OFFICE NOTE  Chief Complaint:  No complaints  Primary Care Physician: Hortense Ramal, MD  HPI:  Tyler Acevedo.  is a 54 year old gentleman who we are following for annual DOT physicals and who is here for a history of coronary artery disease by cardiac catheterization in 2010. He had a 60% mid LAD lesion which was negative by FFR and not significant by IVUS evaluation. He did have soft plaque on IVUS, and it was recommended that he undergo intensive medical therapy, which he is currently on. 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 There has been no clear indication for repeat stress testing, and overall again he is doing well.  Tyler Acevedo has no new complaints. 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 He has maintained on Ranexa, however I wonder what the necessity is at this point. He's had marked improvement in his cholesterol profile, in fact most recently his lipid profile showed particle number of 333, and LDL of 28. Based on this I decreased his Lipitor back to 40 mg daily. We have also had discussions about possibly coming off of Ranexa which she's been hesitant to do in the past however I feel that he can do this and see what his symptoms are. Should he have more angina. He would need to go back on the medication  I saw Tyler Acevedo back in the office today. He has no complaints. He denies any chest pain or shortness of breath. He remains physically active. He is due for repeat lipid profile. He is scheduled to have a stress test today for DOT purposes. Stress test to this point have been negative for ischemia, in spite of a 60% LAD lesions which was not flow limiting.  09/17/2015  Tyler Acevedo returns today for follow-up. Recently he's had a lot of trouble apparently with stress in his life including problems with  work and seemed to be not his usual self today. His wife has been concerned about him but he does not seem to be manifesting any coronary symptoms. He did have an episode where he had some stomach pain and was followed by chest discomfort and low-grade temperature. He did not have nausea or diarrhea was thought to possibly have flulike symptoms including achy joints. He also reports some occasional discomfort in his chest when he gets very upset but that seems to resolve when he rests. On the other hand, he is able to exercise including doing work on treadmill and walking at a fast pace with his wife up and down hills without any chest discomfort or limitations. He's been working on restoring a pontoon boat and doing heavy lifting without any limitations.  09/15/2016  Tyler Acevedo was seen today in follow-up. He is an exceedingly well over the past year. He has managed to lose about 15 pounds but gained some of it back. Blood pressure is well-controlled at 126/82. EKG shows sinus rhythm. He denies any chest pain or worsening shortness of breath. His cholesterol has been very well controlled but is due for reassessment. 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  08/17/2017  Tyler Acevedo returns today for follow-up.  He continues to do well.  Weight is up from 198-206.  He says is been less active.  He does have a left shoulder injury may need rotator cuff surgery.  He denies chest pain or shortness of breath.  Blood pressure is at goal today.  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  He is now 9 years out of his coronary artery disease which was moderate in the LAD.  We discussed again whether or not he needs to continue on Plavix.  He remains asymptomatic.  He is at increased bleeding risk.  After some discussion he is agreeable to stop it.  Should he have recurrent chest pain, we  will need to consider repeat coronary evaluation.  I also felt that he could discontinue his fish oil as is little additional benefit on top of his statin.  He is due for lab work today.  07/12/2019  Tyler Acevedo seen today for follow-up.  We last saw via virtual visit last year.  This continues to be follow-up for 60% LAD lesion seen by cath in 2010.  He had been asymptomatic since then he continues to be active.  He had excellent cholesterol and blood pressure control.  We will need to repeat labs today.  EKG is normal.  Denies any chest pain symptoms.  He is able to do heavy physical activity without limitations and continues to drive a truck.  PMHx:  Past Medical History:  Diagnosis Date  . CAD (coronary artery disease)   . History of nuclear stress test 06/09/2009   exericse myoview; post stress EF 69%; evidence of mild ischemia in basal anteroseptal region  . Hypertension     Past Surgical History:  Procedure Laterality Date  . CARDIAC CATHETERIZATION  2010   LA has ostial 10% stenosis, mid LAD hazy 30-40% stenosis  . HERNIA REPAIR  2006  . KNEE SURGERY  1987  . NOSE SURGERY  1985  . WRIST SURGERY  1982    FAMHx:  Family History  Problem Relation Age of Onset  . Heart attack Father   . Hypertension Father   . Other Paternal Grandmother        Parkinson's    SOCHx:   reports that he has never smoked. He has never used smokeless tobacco. He reports that he does not drink alcohol or use drugs.  ALLERGIES:  No Known Allergies  ROS: A comprehensive review of systems was negative.  HOME MEDS: Current Outpatient Medications  Medication Sig Dispense Refill  . aspirin 81 MG tablet Take 81 mg by mouth daily.    Marland Kitchen atorvastatin (LIPITOR) 40 MG tablet TAKE 1 TABLET BY MOUTH  DAILY AT 6 PM 90 tablet 2  . metoprolol succinate (TOPROL-XL) 25 MG 24 hr tablet TAKE 1 TABLET BY MOUTH  DAILY 90 tablet 2  . nitroGLYCERIN (NITROSTAT) 0.4 MG SL tablet Dissolve 1 tablet under the tongue  every 5 minutes as  needed for chest pain 100 tablet 5  . Omega-3 Fatty Acids (OMEGA-3 FISH OIL) 1200 MG CAPS Take 1 capsule by mouth. Take 1200 mg twice a day    . venlafaxine XR (EFFEXOR-XR) 37.5 MG 24 hr capsule TAKE 1 CAPSULE BY MOUTH  DAILY WITH BREAKFAST 90 capsule 0  . vitamin C (ASCORBIC ACID) 500 MG tablet Take 1,000 mg by mouth daily.      No current facility-administered medications for this visit.    LABS/IMAGING: No results found for this or any previous visit (from the past 48 hour(s)). No results found.  VITALS: BP 108/68  Pulse 75   Temp 97.7 F (36.5 C)   Ht 5\' 8"  (1.727 m)   Wt 207 lb 9.6 oz (94.2 kg)   SpO2 99%   BMI 31.57 kg/m   EXAM: General appearance: alert and no distress Neck: no adenopathy, no carotid bruit, no JVD, supple, symmetrical, trachea midline and thyroid not enlarged, symmetric, no tenderness/mass/nodules Lungs: clear to auscultation bilaterally Heart: regular rate and rhythm, S1, S2 normal, no murmur, click, rub or gallop Abdomen: soft, non-tender; bowel sounds normal; no masses,  no organomegaly Extremities: extremities normal, atraumatic, no cyanosis or edema Pulses: 2+ and symmetric Skin: Skin color, texture, turgor normal. No rashes or lesions Neurologic: Grossly normal  EKG: Normal sinus rhythm 64-personally reviewed  ASSESSMENT: 1. Coronary artery disease with a 60% mid LAD lesion in 2010 2. Dyslipidemia - at goal 3. Hypertension - controlled  PLAN: 1.   Tyler Acevedo continues to do well without any new obstructive coronary symptoms.  Its been now 11 years since his heart catheter showed moderate to severe LAD stenosis however he has been asymptomatic.  His cholesterols been well controlled and blood pressure is at goal.  He remains physically active.  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  Follow-up with me annually or sooner as necessary.  Lisbeth Ply, MD, [facility], FACP  [place]   [facility]  Medical Director of the Advanced Lipid Disorders &  Cardiovascular Risk Reduction Clinic Diplomate of the American Board of Clinical Lipidology Attending Cardiologist  Direct Dial: [phone]  Fax: [phone]  Website:  [email or web address] Tyler Acevedo 07/12/2019, 8:15 AM

## 2019-07-12 NOTE — Patient Instructions (Signed)
Medication Instructions:  Your physician recommends that you continue on your current medications as directed. Please refer to the Current Medication list given to you today.  *If you need a refill on your cardiac medications before your next appointment, please call your pharmacy*   Lab Work: FASTING lab work today - CMET, lipid panel If you have labs (blood work) drawn today and your tests are completely normal, you will receive your results only by: Marland Kitchen MyChart Message (if you have MyChart) OR . A paper copy in the mail If you have any lab test that is abnormal or we need to change your treatment, we will call you to review the results.   Testing/Procedures: NONE   Follow-Up: At [facility], you and your health needs are our priority.  As part of our continuing mission to provide you with exceptional heart care, we have created designated Provider Care Teams.  These Care Teams include your primary Cardiologist (physician) and Advanced Practice Providers (APPs -  Physician Assistants and Nurse Practitioners) who all work together to provide you with the care you need, when you need it.  We recommend signing up for the patient portal called "MyChart".  Sign up information is provided on this After Visit Summary.  MyChart is used to connect with patients for Virtual Visits (Telemedicine).  Patients are able to view lab/test results, encounter notes, upcoming appointments, etc.  Non-urgent messages can be sent to your provider as well.   To learn more about what you can do with MyChart, go to [email or web address].    Your next appointment:   12 month(s)  The format for your next appointment:   Either In Person or Virtual  Provider:   Kirtland Bouchard [place] Hilty, MD   Other Instructions

## 2019-08-13 ENCOUNTER — Other Ambulatory Visit: Payer: Self-pay | Admitting: Internal Medicine

## 2019-08-26 ENCOUNTER — Other Ambulatory Visit: Payer: Self-pay | Admitting: Internal Medicine

## 2020-06-26 ENCOUNTER — Encounter: Payer: Self-pay | Admitting: Internal Medicine

## 2020-06-26 ENCOUNTER — Other Ambulatory Visit: Payer: Self-pay

## 2020-06-26 ENCOUNTER — Ambulatory Visit: Payer: 59 | Admitting: Internal Medicine

## 2020-06-26 VITALS — BP 112/72 | HR 57 | Ht 68.0 in | Wt 183.0 lb

## 2020-06-26 DIAGNOSIS — E785 Hyperlipidemia, unspecified: Secondary | ICD-10-CM | POA: Diagnosis not present

## 2020-06-26 DIAGNOSIS — I2583 Coronary atherosclerosis due to lipid rich plaque: Secondary | ICD-10-CM

## 2020-06-26 DIAGNOSIS — I2510 Atherosclerotic heart disease of native coronary artery without angina pectoris: Secondary | ICD-10-CM

## 2020-06-26 DIAGNOSIS — I10 Essential (primary) hypertension: Secondary | ICD-10-CM

## 2020-06-26 LAB — LIPID PANEL
Chol/HDL Ratio: 2.1 ratio (ref 0.0–5.0)
Cholesterol, Total: 118 mg/dL (ref 100–199)
HDL: 55 mg/dL (ref >39)
LDL Chol Calc (NIH): 48 mg/dL (ref 0–99)
Triglycerides: 72 mg/dL (ref 0–149)
VLDL Cholesterol Cal: 15 mg/dL (ref 5–40)

## 2020-06-26 LAB — COMPREHENSIVE METABOLIC PANEL
ALT: 22 IU/L (ref 0–44)
AST: 17 IU/L (ref 0–40)
Albumin/Globulin Ratio: 2.2 (ref 1.2–2.2)
Albumin: 4.8 g/dL (ref 3.8–4.9)
Alkaline Phosphatase: 46 IU/L (ref 44–121)
BUN/Creatinine Ratio: 12 (ref 9–20)
BUN: 12 mg/dL (ref 6–24)
Bilirubin Total: 1 mg/dL (ref 0.0–1.2)
CO2: 25 mmol/L (ref 20–29)
Calcium: 9.5 mg/dL (ref 8.7–10.2)
Chloride: 102 mmol/L (ref 96–106)
Creatinine, Ser: 0.98 mg/dL (ref 0.76–1.27)
Globulin, Total: 2.2 g/dL (ref 1.5–4.5)
Glucose: 111 mg/dL — ABNORMAL HIGH (ref 65–99)
Potassium: 4.3 mmol/L (ref 3.5–5.2)
Sodium: 142 mmol/L (ref 134–144)
Total Protein: 7 g/dL (ref 6.0–8.5)
eGFR: 93 mL/min/{1.73_m2} (ref >59)

## 2020-06-26 MED ORDER — ATORVASTATIN CALCIUM 40 MG PO TABS: ORAL_TABLET | ORAL | 3 refills | Status: DC

## 2020-06-26 MED ORDER — VENLAFAXINE HCL ER 37.5 MG PO CP24: 37.5000 mg | ORAL_CAPSULE | Freq: Every day | ORAL | 3 refills | Status: DC

## 2020-06-26 MED ORDER — METOPROLOL SUCCINATE ER 25 MG PO TB24: 25.0000 mg | ORAL_TABLET | Freq: Every day | ORAL | 3 refills | Status: DC

## 2020-06-26 NOTE — Progress Notes (Signed)
OFFICE NOTE  Chief Complaint:  No complaints  Primary Care Physician: Hortense Ramal, MD  HPI:  Tyler Acevedo.  is a 55 year old gentleman who we are following for annual DOT physicals and who is here for a history of coronary artery disease by cardiac catheterization in 2010. He had a 60% mid LAD lesion which was negative by FFR and not significant by IVUS evaluation. He did have soft plaque on IVUS, and it was recommended that he undergo intensive medical therapy, which he is currently on. 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 There has been no clear indication for repeat stress testing, and overall again he is doing well.  Tyler Acevedo has no new complaints. 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 He has maintained on Ranexa, however I wonder what the necessity is at this point. He's had marked improvement in his cholesterol profile, in fact most recently his lipid profile showed particle number of 333, and LDL of 28. Based on this I decreased his Lipitor back to 40 mg daily. We have also had discussions about possibly coming off of Ranexa which she's been hesitant to do in the past however I feel that he can do this and see what his symptoms are. Should he have more angina. He would need to go back on the medication  I saw Tyler Acevedo back in the office today. He has no complaints. He denies any chest pain or shortness of breath. He remains physically active. He is due for repeat lipid profile. He is scheduled to have a stress test today for DOT purposes. Stress test to this point have been negative for ischemia, in spite of a 60% LAD lesions which was not flow limiting.  09/17/2015  Tyler Acevedo returns today for follow-up. Recently he's had a lot of trouble apparently with stress in his life including problems with  work and seemed to be not his usual self today. His wife has been concerned about him but he does not seem to be manifesting any coronary symptoms. He did have an episode where he had some stomach pain and was followed by chest discomfort and low-grade temperature. He did not have nausea or diarrhea was thought to possibly have flulike symptoms including achy joints. He also reports some occasional discomfort in his chest when he gets very upset but that seems to resolve when he rests. On the other hand, he is able to exercise including doing work on treadmill and walking at a fast pace with his wife up and down hills without any chest discomfort or limitations. He's been working on restoring a pontoon boat and doing heavy lifting without any limitations.  09/15/2016  Tyler Acevedo was seen today in follow-up. He is an exceedingly well over the past year. He has managed to lose about 15 pounds but gained some of it back. Blood pressure is well-controlled at 126/82. EKG shows sinus rhythm. He denies any chest pain or worsening shortness of breath. His cholesterol has been very well controlled but is due for reassessment. 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  08/17/2017  Tyler Acevedo returns today for follow-up.  He continues to do well.  Weight is up from 198-206.  He says is been less active.  He does have a left shoulder injury may need rotator cuff surgery.  He denies chest pain or shortness of breath.  Blood pressure is at goal today.  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  He is now 9 years out of his coronary artery disease which was moderate in the LAD.  We discussed again whether or not he needs to continue on Plavix.  He remains asymptomatic.  He is at increased bleeding risk.  After some discussion he is agreeable to stop it.  Should he have recurrent chest pain, we  will need to consider repeat coronary evaluation.  I also felt that he could discontinue his fish oil as is little additional benefit on top of his statin.  He is due for lab work today.  07/12/2019  Tyler Acevedo seen today for follow-up.  We last saw via virtual visit last year.  This continues to be follow-up for 60% LAD lesion seen by cath in 2010.  He had been asymptomatic since then he continues to be active.  He had excellent cholesterol and blood pressure control.  We will need to repeat labs today.  EKG is normal.  Denies any chest pain symptoms.  He is able to do heavy physical activity without limitations and continues to drive a truck.  06/26/2020  Tyler Acevedo returns today for follow-up.  This is an annual visit.  Overall he says he is doing really well.  He is lost a lot of weight, down to 183 from 207.  He did this in combination with his wife who follow the Nutrisystem plan.  He says he feels well.  He is active.  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  I did fill out paperwork today for authorizing another year of DOT driving.  I see no reason why cannot complete his duties.  EKG shows a sinus bradycardia at 57.  Blood pressure is well controlled at 112/72.  He is compliant with his medications.  His LDL cholesterol had been quite low at 48 last year.  Given his recent 30 pound weight loss, is likely that his cholesterol will be even lower and we may need to reduce his medication.  PMHx:  Past Medical History:  Diagnosis Date  . CAD (coronary artery disease)   . History of nuclear stress test 06/09/2009   exericse myoview; post stress EF 69%; evidence of mild ischemia in basal anteroseptal region  . Hypertension     Past Surgical History:  Procedure Laterality Date  . CARDIAC CATHETERIZATION  2010   LA has ostial 10% stenosis, mid LAD hazy 30-40% stenosis  . HERNIA REPAIR  2006  . KNEE SURGERY  1987  . NOSE SURGERY  1985  . WRIST SURGERY  1982    FAMHx:   Family History  Problem Relation Age of Onset  . Heart attack Father   . Hypertension Father   . Other Paternal Grandmother        Parkinson's    SOCHx:   reports that he has never smoked. He has never used smokeless tobacco. He reports that he does not drink alcohol and does not use drugs.  ALLERGIES:  No Known Allergies  ROS: A comprehensive review of systems was negative.  HOME MEDS: Current Outpatient Medications  Medication Sig Dispense Refill  . aspirin 81 MG tablet Take 81 mg by mouth daily.    Marland Kitchen atorvastatin (LIPITOR) 40 MG tablet  TAKE 1 TABLET BY MOUTH  DAILY AT 6 PM 90 tablet 3  . metoprolol succinate (TOPROL-XL) 25 MG 24 hr tablet TAKE 1 TABLET BY MOUTH  DAILY 90 tablet 3  . nitroGLYCERIN (NITROSTAT) 0.4 MG SL tablet Dissolve 1 tablet under the tongue every 5 minutes as  needed for chest pain 100 tablet 5  . Omega-3 Fatty Acids (OMEGA-3 FISH OIL) 1200 MG CAPS Take 1 capsule by mouth. Take 1200 mg twice a day    . venlafaxine XR (EFFEXOR-XR) 37.5 MG 24 hr capsule TAKE 1 CAPSULE BY MOUTH  DAILY WITH BREAKFAST 90 capsule 3  . vitamin C (ASCORBIC ACID) 500 MG tablet Take 1,000 mg by mouth daily.      No current facility-administered medications for this visit.    LABS/IMAGING: No results found for this or any previous visit (from the past 48 hour(s)). No results found.  VITALS: BP 112/72   Pulse (!) 57   Ht 5\' 8"  (1.727 m)   Wt 183 lb (83 kg)   SpO2 99%   BMI 27.83 kg/m   EXAM: General appearance: alert and no distress Neck: no adenopathy, no carotid bruit, no JVD, supple, symmetrical, trachea midline and thyroid not enlarged, symmetric, no tenderness/mass/nodules Lungs: clear to auscultation bilaterally Heart: regular rate and rhythm, S1, S2 normal, no murmur, click, rub or gallop Abdomen: soft, non-tender; bowel sounds normal; no masses,  no organomegaly Extremities: extremities normal, atraumatic, no cyanosis or edema Pulses: 2+ and symmetric Skin: Skin  color, texture, turgor normal. No rashes or lesions Neurologic: Grossly normal  EKG: Sinus bradycardia 57-personally reviewed  ASSESSMENT: 1. Coronary artery disease with a 60% mid LAD lesion in 2010 2. Dyslipidemia - at goal 3. Hypertension - controlled  PLAN: 1.   Mr. Khim seems to be doing great.  He denies any chest pain or shortness of breath.  He had recent 30 pound weight loss for which he is feeling quite a bit better.  This was intentional.  His cholesterol has been well treated and we may be able to reduce his statin even more.  Plan repeat cholesterol and metabolic profile today.  Blood pressure goal.  He is cleared for DOT driving from a cardiology standpoint for the next year.  Plan follow-up with me annually or sooner as necessary.  Lisbeth Ply, MD, [facility], FACP  [place]  [facility]  Medical Director of the Advanced Lipid Disorders &  Cardiovascular Risk Reduction Clinic Diplomate of the American Board of Clinical Lipidology Attending Cardiologist  Direct Dial: [phone]  Fax: [phone]  Website:  [email or web address] 06/26/2020, 8:34 AM

## 2020-06-26 NOTE — Patient Instructions (Signed)
Medication Instructions:  Your physician recommends that you continue on your current medications as directed. Please refer to the Current Medication list given to you today.  *If you need a refill on your cardiac medications before your next appointment, please call your pharmacy*   Lab Work: Lipid Panel, CMET today  If you have labs (blood work) drawn today and your tests are completely normal, you will receive your results only by: Marland Kitchen MyChart Message (if you have MyChart) OR . A paper copy in the mail If you have any lab test that is abnormal or we need to change your treatment, we will call you to review the results.   Testing/Procedures: NONE   Follow-Up: At [facility], you and your health needs are our priority.  As part of our continuing mission to provide you with exceptional heart care, we have created designated Provider Care Teams.  These Care Teams include your primary Cardiologist (physician) and Advanced Practice Providers (APPs -  Physician Assistants and Nurse Practitioners) who all work together to provide you with the care you need, when you need it.  We recommend signing up for the patient portal called "MyChart".  Sign up information is provided on this After Visit Summary.  MyChart is used to connect with patients for Virtual Visits (Telemedicine).  Patients are able to view lab/test results, encounter notes, upcoming appointments, etc.  Non-urgent messages can be sent to your provider as well.   To learn more about what you can do with MyChart, go to [email or web address].    Your next appointment:   12 month(s)  The format for your next appointment:   In Person  Provider:   You may see Dr. Rennis Golden or one of the following Advanced Practice Providers on your designated Care Team:    Azalee Course, PA-C  Micah Flesher, [place] or   Judy Pimple, [place]    Other Instructions

## 2020-07-17 ENCOUNTER — Ambulatory Visit: Payer: 59 | Admitting: Internal Medicine

## 2021-06-08 ENCOUNTER — Encounter: Payer: Self-pay | Admitting: Internal Medicine

## 2021-06-08 DIAGNOSIS — I2583 Coronary atherosclerosis due to lipid rich plaque: Secondary | ICD-10-CM

## 2021-06-08 DIAGNOSIS — I2510 Atherosclerotic heart disease of native coronary artery without angina pectoris: Secondary | ICD-10-CM

## 2021-06-08 DIAGNOSIS — Z0289 Encounter for other administrative examinations: Secondary | ICD-10-CM

## 2021-06-08 DIAGNOSIS — E785 Hyperlipidemia, unspecified: Secondary | ICD-10-CM

## 2021-06-09 NOTE — Progress Notes (Signed)
Office Visit    Patient Name: Tyler Acevedo. Date of Encounter: 06/11/2021  PCP:  Hortense Ramal, MD   [facility]  Cardiologist:  Chrystie Nose, MD  Advanced Practice Provider:  No care team member to display Electrophysiologist:  None    Chief Complaint    Gamble Enderle. is a 56 y.o. male with a hx of CAD by cardiac catheterization in 2010 (60% mid LAD lesion which was negative by FFR and nonsignificant by IVUS) and hypertension presents today for follow-up for annual DOT clearance.    He was last seen by Dr. Rennis Golden in March 2022 and had no new complaints.  He continued to be active and tracking and does a lot of moving and lifting without any pain or shortness of breath.  He has maintained on Ranexa, however this might not be necessary.  He has had marked improvement in his cholesterol profile.  His Lipitor was decreased back to 40 mg daily.  He underwent a stress test for DOT purposes.  All stress test have been negative for ischemia in spite of 60% LAD lesion which was not flow-limiting.  He has been followed since 2017 annually.  When last seen March 2022 he was overall doing really well.  He had lost a lot of weight and was down to 183 pounds.  He did this in combination with his wife who follows the Nutrisystem plan.  He remained active.  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  His EKG showed sinus bradycardia, rate 57 bpm.  Blood pressure was well controlled.  His LDL has been quite low and well controlled.  Today, he feels good without any cardiac symptoms. He has not had any chest pain or SOB. He stays active when he is on the road and continues to eat a heart healthy lifestyle. He has maintained his weight and enjoys spending time with his grandkids. He has been on the same cardiac medications and has been compliant. We have not made any medication changes today. His BP remains well controlled today and at home. He was just at the  lab and had a lipid panel and CMP drawn. We will follow-up with those results.   Reports no shortness of breath nor dyspnea on exertion. Reports no chest pain, pressure, or tightness. No edema, orthopnea, PND. Reports no palpitations.     Past Medical History    Past Medical History:  Diagnosis Date   CAD (coronary artery disease)    History of nuclear stress test 06/09/2009   exericse myoview; post stress EF 69%; evidence of mild ischemia in basal anteroseptal region   Hypertension    Past Surgical History:  Procedure Laterality Date   CARDIAC CATHETERIZATION  2010   LA has ostial 10% stenosis, mid LAD hazy 30-40% stenosis   HERNIA REPAIR  2006   KNEE SURGERY  1987   NOSE SURGERY  1985   WRIST SURGERY  1982    Allergies  No Known Allergies  EKGs/Labs/Other Studies Reviewed:   The following studies were reviewed today:  Stress test 09/19/2014  Low risk pharmacological stress nuclear study with normal perfusion and normal left ventricular regional and global systolic function.    EKG:  EKG is  ordered today.  The ekg ordered today demonstrates NSR rate 59 bpm  Recent Labs: 06/26/2020: ALT 22; BUN 12; Creatinine, Ser 0.98; Potassium 4.3; Sodium 142  Recent Lipid Panel    Component  Value Date/Time   CHOL 118 06/26/2020 0851   CHOL 110 09/19/2014 1335   TRIG 72 06/26/2020 0851   TRIG 131 09/19/2014 1335   HDL 55 06/26/2020 0851   HDL 44 09/19/2014 1335   CHOLHDL 2.1 06/26/2020 0851   CHOLHDL 4.3 04/30/2008 0447   VLDL 47 (H) 04/30/2008 0447   LDLCALC 48 06/26/2020 0851   LDLCALC 40 09/19/2014 1335     Home Medications   Current Meds  Medication Sig   aspirin 81 MG tablet Take 81 mg by mouth daily.   nitroGLYCERIN (NITROSTAT) 0.4 MG SL tablet Dissolve 1 tablet under the tongue every 5 minutes as  needed for chest pain   Omega-3 Fatty Acids (OMEGA-3 FISH OIL) 1200 MG CAPS Take 1 capsule by mouth. Take 1200 mg twice a day   vitamin C (ASCORBIC ACID) 500 MG tablet  Take 1,000 mg by mouth daily.    [DISCONTINUED] atorvastatin (LIPITOR) 40 MG tablet TAKE 1 TABLET BY MOUTH  DAILY AT 6 PM   [DISCONTINUED] metoprolol succinate (TOPROL-XL) 25 MG 24 hr tablet Take 1 tablet (25 mg total) by mouth daily.   [DISCONTINUED] venlafaxine XR (EFFEXOR-XR) 37.5 MG 24 hr capsule Take 1 capsule (37.5 mg total) by mouth daily with breakfast.     Review of Systems      All other systems reviewed and are otherwise negative except as noted above.  Physical Exam    VS:  BP 120/78 (BP Location: Right Arm, Patient Position: Sitting, Cuff Size: Normal)    Pulse (!) 59    Ht 5\' 8"  (1.727 m)    Wt 188 lb 6.4 oz (85.5 kg)    BMI 28.65 kg/m  , BMI Body mass index is 28.65 kg/m.  Wt Readings from Last 3 Encounters:  06/11/21 188 lb 6.4 oz (85.5 kg)  06/26/20 183 lb (83 kg)  07/12/19 207 lb 9.6 oz (94.2 kg)     GEN: Well nourished, well developed, in no acute distress. HEENT: normal. Neck: Supple, no JVD, carotid bruits, or masses. Cardiac: RRR, no murmurs, rubs, or gallops. No clubbing, cyanosis, edema.  Radials/PT 2+ and equal bilaterally.  Respiratory:  Respirations regular and unlabored, clear to auscultation bilaterally. GI: Soft, nontender, nondistended. MS: No deformity or atrophy. Skin: Warm and dry, no rash. Neuro:  Strength and sensation are intact. Psych: Normal affect.  Assessment & Plan    CAD, 60% mid LAD lesion in 2010 -no chest pain or SOB -Has maintained a healthy lifestyle with diet and exercise  Dyslipidemia -got labs today, will review once resulted  Hypertension -120/78 -When he monitors at home, it is usually 120/80s sometimes lower    Disposition: Follow up 1 year with 2011, MD or APP.  Signed, Chrystie Nose, PA-C 06/11/2021, 10:23 AM [facility]

## 2021-06-11 ENCOUNTER — Other Ambulatory Visit: Payer: Self-pay

## 2021-06-11 ENCOUNTER — Ambulatory Visit (HOSPITAL_BASED_OUTPATIENT_CLINIC_OR_DEPARTMENT_OTHER): Payer: 59 | Admitting: Physician Assistant

## 2021-06-11 ENCOUNTER — Encounter (HOSPITAL_BASED_OUTPATIENT_CLINIC_OR_DEPARTMENT_OTHER): Payer: Self-pay | Admitting: Physician Assistant

## 2021-06-11 VITALS — BP 120/78 | HR 59 | Ht 68.0 in | Wt 188.4 lb

## 2021-06-11 DIAGNOSIS — I2583 Coronary atherosclerosis due to lipid rich plaque: Secondary | ICD-10-CM

## 2021-06-11 DIAGNOSIS — I2510 Atherosclerotic heart disease of native coronary artery without angina pectoris: Secondary | ICD-10-CM | POA: Diagnosis not present

## 2021-06-11 DIAGNOSIS — I10 Essential (primary) hypertension: Secondary | ICD-10-CM

## 2021-06-11 DIAGNOSIS — E785 Hyperlipidemia, unspecified: Secondary | ICD-10-CM

## 2021-06-11 LAB — LIPID PANEL
Chol/HDL Ratio: 2.1 ratio (ref 0.0–5.0)
Cholesterol, Total: 114 mg/dL (ref 100–199)
HDL: 55 mg/dL (ref >39)
LDL Chol Calc (NIH): 42 mg/dL (ref 0–99)
Triglycerides: 87 mg/dL (ref 0–149)
VLDL Cholesterol Cal: 17 mg/dL (ref 5–40)

## 2021-06-11 LAB — COMPREHENSIVE METABOLIC PANEL
ALT: 26 IU/L (ref 0–44)
AST: 23 IU/L (ref 0–40)
Albumin/Globulin Ratio: 2.7 — ABNORMAL HIGH (ref 1.2–2.2)
Albumin: 4.8 g/dL (ref 3.8–4.9)
Alkaline Phosphatase: 51 IU/L (ref 44–121)
BUN/Creatinine Ratio: 12 (ref 9–20)
BUN: 13 mg/dL (ref 6–24)
Bilirubin Total: 0.8 mg/dL (ref 0.0–1.2)
CO2: 28 mmol/L (ref 20–29)
Calcium: 9.5 mg/dL (ref 8.7–10.2)
Chloride: 104 mmol/L (ref 96–106)
Creatinine, Ser: 1.07 mg/dL (ref 0.76–1.27)
Globulin, Total: 1.8 g/dL (ref 1.5–4.5)
Glucose: 100 mg/dL — ABNORMAL HIGH (ref 70–99)
Potassium: 4.8 mmol/L (ref 3.5–5.2)
Sodium: 142 mmol/L (ref 134–144)
Total Protein: 6.6 g/dL (ref 6.0–8.5)
eGFR: 83 mL/min/{1.73_m2} (ref >59)

## 2021-06-11 MED ORDER — ATORVASTATIN CALCIUM 40 MG PO TABS: ORAL_TABLET | ORAL | 3 refills | Status: DC

## 2021-06-11 MED ORDER — VENLAFAXINE HCL ER 37.5 MG PO CP24: 37.5000 mg | ORAL_CAPSULE | Freq: Every day | ORAL | 3 refills | Status: DC

## 2021-06-11 MED ORDER — METOPROLOL SUCCINATE ER 25 MG PO TB24: 25.0000 mg | ORAL_TABLET | Freq: Every day | ORAL | 3 refills | Status: DC

## 2021-06-11 NOTE — Telephone Encounter (Signed)
Patient seen by Jari Favre, PA today for DOT clearance. He was without anginal symptoms so ischemic evaluation recommended. As DOT physician has requested - okay to order and schedule ETT for CAD.  Thanks!  Alver Sorrow, NP

## 2021-06-11 NOTE — Telephone Encounter (Signed)
Here ya go!  

## 2021-06-11 NOTE — Patient Instructions (Signed)
Medication Instructions:  Your Physician recommend you continue on your current medication as directed.    *If you need a refill on your cardiac medications before your next appointment, please call your pharmacy*   Follow-Up: At [facility], you and your health needs are our priority.  As part of our continuing mission to provide you with exceptional heart care, we have created designated Provider Care Teams.  These Care Teams include your primary Cardiologist (physician) and Advanced Practice Providers (APPs -  Physician Assistants and Nurse Practitioners) who all work together to provide you with the care you need, when you need it.  We recommend signing up for the patient portal called "MyChart".  Sign up information is provided on this After Visit Summary.  MyChart is used to connect with patients for Virtual Visits (Telemedicine).  Patients are able to view lab/test results, encounter notes, upcoming appointments, etc.  Non-urgent messages can be sent to your provider as well.   To learn more about what you can do with MyChart, go to [email or web address].    Your next appointment:   1 year(s)  The format for your next appointment:   In Person  Provider:   Pixie Casino, MD {

## 2021-06-15 NOTE — Addendum Note (Signed)
Addended by: Alver Sorrow on: 06/15/2021 11:09 AM   Modules accepted: Orders

## 2021-06-15 NOTE — Progress Notes (Signed)
Received notice from patient the DOT provider requested stress test. Stress test ordered and attestation entered.   Alver Sorrow, NP

## 2021-07-15 ENCOUNTER — Telehealth (HOSPITAL_COMMUNITY): Payer: Self-pay | Admitting: *Deleted

## 2021-07-15 NOTE — Telephone Encounter (Signed)
Close encounter 

## 2021-07-16 ENCOUNTER — Ambulatory Visit (HOSPITAL_COMMUNITY)
Admission: RE | Admit: 2021-07-16 | Discharge: 2021-07-16 | Disposition: A | Discharge status: Home / Self Care | Payer: 59 | Source: Ambulatory Visit | Attending: Cardiovascular Disease | Admitting: Cardiovascular Disease

## 2021-07-16 DIAGNOSIS — I2510 Atherosclerotic heart disease of native coronary artery without angina pectoris: Secondary | ICD-10-CM | POA: Insufficient documentation

## 2021-07-16 DIAGNOSIS — I2583 Coronary atherosclerosis due to lipid rich plaque: Secondary | ICD-10-CM | POA: Diagnosis not present

## 2021-07-16 DIAGNOSIS — Z0289 Encounter for other administrative examinations: Secondary | ICD-10-CM | POA: Diagnosis not present

## 2021-07-16 LAB — EXERCISE TOLERANCE TEST
Angina Index: 0
Estimated workload: 13.4
Exercise duration (min): 12 min
Exercise duration (sec): 0 s
MPHR: 167 {beats}/min
Peak HR: 179 {beats}/min
Percent HR: 107 %
Rest HR: 65 {beats}/min

## 2022-05-15 ENCOUNTER — Other Ambulatory Visit (HOSPITAL_BASED_OUTPATIENT_CLINIC_OR_DEPARTMENT_OTHER): Payer: Self-pay | Admitting: Physician Assistant

## 2022-05-16 NOTE — Telephone Encounter (Signed)
Patient of Dr. Hilty. Please review for refill. Thank you!  

## 2022-05-27 ENCOUNTER — Other Ambulatory Visit (HOSPITAL_BASED_OUTPATIENT_CLINIC_OR_DEPARTMENT_OTHER): Payer: Self-pay | Admitting: Physician Assistant

## 2022-05-27 NOTE — Telephone Encounter (Signed)
Patient of Dr. Debara Pickett. Please review for refill. Thank you!

## 2022-06-16 ENCOUNTER — Other Ambulatory Visit (HOSPITAL_BASED_OUTPATIENT_CLINIC_OR_DEPARTMENT_OTHER): Payer: Self-pay | Admitting: Internal Medicine

## 2022-06-28 ENCOUNTER — Ambulatory Visit: Payer: 59 | Admitting: Physician Assistant

## 2022-06-30 NOTE — Progress Notes (Signed)
Office Visit    Patient Name: Tyler Acevedo. Date of Encounter: 06/30/2022  PCP:  Octavia Bruckner, [place]  Cardiologist:  Pixie Casino, MD  Advanced Practice Provider:  No care team member to display Electrophysiologist:  None    Chief Complaint    Randy Whitus. is a 57 y.o. male with a hx of CAD by cardiac catheterization in 2010 (60% mid LAD lesion which was negative by FFR and nonsignificant by IVUS) and hypertension presents today for follow-up for annual DOT clearance.    He was last seen by Dr. Debara Pickett in March 2022 and had no new complaints.  He continued to be active and tracking and does a lot of moving and lifting without any pain or shortness of breath.  He has maintained on Ranexa, however this might not be necessary.  He has had marked improvement in his cholesterol profile.  His Lipitor was decreased back to 40 mg daily.  He underwent a stress test for DOT purposes.  All stress test have been negative for ischemia in spite of 60% LAD lesion which was not flow-limiting.  He has been followed since 2017 annually.  When last seen March 2022 he was overall doing really well.  He had lost a lot of weight and was down to 183 pounds.  He did this in combination with his wife who follows the [employer] plan.  He remained active.  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  His EKG showed sinus bradycardia, rate 57 bpm.  Blood pressure was well controlled.  His LDL has been quite low and well controlled.  He was seen by me 2/24 and  he felt good without any cardiac symptoms. He has not had any chest pain or SOB. He stays active when he is on the road and continues to eat a heart healthy lifestyle. He has maintained his weight and enjoys spending time with his grandkids. He has been on the same cardiac medications and has been compliant. We have not made any medication changes today. His BP remains well controlled today and at  home. He was just at the lab and had a lipid panel and CMP drawn. We will follow-up with those results.   Today, he tells me he has had no new cardiac issues.  He continues to remain active.  No chest pain or shortness of breath.  He continues to spend time with his 4 grandkids.  He has a new one that just turned 6 months.  He is here for his annual DOT clearance appointment.  Next year he will require a stress test for clearance and this has been arranged today.  Reports no shortness of breath nor dyspnea on exertion. Reports no chest pain, pressure, or tightness. No edema, orthopnea, PND. Reports no palpitations.     Past Medical History    Past Medical History:  Diagnosis Date   CAD (coronary artery disease)    History of nuclear stress test 06/09/2009   exericse myoview; post stress EF 69%; evidence of mild ischemia in basal anteroseptal region   Hypertension    Past Surgical History:  Procedure Laterality Date   CARDIAC CATHETERIZATION  2010   LA has ostial 10% stenosis, mid LAD hazy 30-40% stenosis   HERNIA REPAIR  2006   [place]    Allergies  No Known Allergies  EKGs/Labs/Other Studies Reviewed:   The following studies were reviewed today:  Stress test 07/16/21  Personally reviewed exercise stress test - some mild EKG changes, however, would consider negative - he did >12 minutes of exercise. No chest pain symptoms.  Ok to continue DOT driving.  -Dr. Debara Pickett  Stress test 09/19/2014  Low risk pharmacological stress nuclear study with normal perfusion and normal left ventricular regional and global systolic function.    EKG:  EKG is  ordered today.  The ekg ordered today demonstrates NSR rate 59 bpm  Recent Labs: No results found for requested labs within last 365 days.  Recent Lipid Panel    Component Value Date/Time   CHOL 114 06/11/2021 0948   CHOL 110 09/19/2014 1335   TRIG 87 06/11/2021 0948   TRIG 131  09/19/2014 1335   HDL 55 06/11/2021 0948   HDL 44 09/19/2014 1335   CHOLHDL 2.1 06/11/2021 0948   CHOLHDL 4.3 04/30/2008 0447   VLDL 47 (H) 04/30/2008 0447   LDLCALC 42 06/11/2021 0948   LDLCALC 40 09/19/2014 1335     Home Medications   No outpatient medications have been marked as taking for the 07/01/22 encounter (Appointment) with Elgie Collard, PA-C.     Review of Systems      All other systems reviewed and are otherwise negative except as noted above.  Physical Exam    VS:  There were no vitals taken for this visit. , BMI There is no height or weight on file to calculate BMI.  Wt Readings from Last 3 Encounters:  06/11/21 188 lb 6.4 oz (85.5 kg)  06/26/20 183 lb (83 kg)  07/12/19 207 lb 9.6 oz (94.2 kg)     GEN: Well nourished, well developed, in no acute distress. HEENT: normal. Neck: Supple, no JVD, carotid bruits, or masses. Cardiac: RRR, no murmurs, rubs, or gallops. No clubbing, cyanosis, edema.  Radials/PT 2+ and equal bilaterally.  Respiratory:  Respirations regular and unlabored, clear to auscultation bilaterally. GI: Soft, nontender, nondistended. MS: No deformity or atrophy. Skin: Warm and dry, no rash. Neuro:  Strength and sensation are intact. Psych: Normal affect.  Assessment & Plan    CAD, 60% mid LAD lesion in 2010 -no chest pain or SOB -Continue current medication regimen including aspirin 81 mg daily, Lipitor 40 mg daily, metoprolol succinate 25 mg daily, nitro as needed, omega-3 fish oil 1200 mg twice a day -Has maintained a healthy lifestyle with diet and exercise  Dyslipidemia -lipid panel today -LDL 42, HDL 55, total cholesterol 114, triglycerides 87 (06/11/2021) -continue Lipitor 40mg  daily  Hypertension -132/82 -Continue to monitor blood pressure at home    Disposition: Follow up 1 year with Pixie Casino, MD or APP.  Signed, Elgie Collard, PA-C 06/30/2022, 12:57 PM [facility]

## 2022-07-01 ENCOUNTER — Ambulatory Visit: Payer: 59 | Attending: Physician Assistant | Admitting: Physician Assistant

## 2022-07-01 ENCOUNTER — Encounter: Payer: Self-pay | Admitting: Physician Assistant

## 2022-07-01 ENCOUNTER — Encounter: Payer: Self-pay | Admitting: *Deleted

## 2022-07-01 ENCOUNTER — Ambulatory Visit: Payer: 59

## 2022-07-01 VITALS — BP 132/82 | HR 60 | Ht 68.0 in | Wt 187.2 lb

## 2022-07-01 DIAGNOSIS — Z0289 Encounter for other administrative examinations: Secondary | ICD-10-CM | POA: Diagnosis not present

## 2022-07-01 DIAGNOSIS — I2510 Atherosclerotic heart disease of native coronary artery without angina pectoris: Secondary | ICD-10-CM

## 2022-07-01 DIAGNOSIS — E785 Hyperlipidemia, unspecified: Secondary | ICD-10-CM

## 2022-07-01 DIAGNOSIS — I10 Essential (primary) hypertension: Secondary | ICD-10-CM

## 2022-07-01 NOTE — Patient Instructions (Addendum)
Medication Instructions:  Your physician recommends that you continue on your current medications as directed. Please refer to the Current Medication list given to you today.  *If you need a refill on your cardiac medications before your next appointment, please call your pharmacy*   Lab Work: Fasting lipids and lft's today If you have labs (blood work) drawn today and your tests are completely normal, you will receive your results only by: [place] (if you have MyChart) OR A paper copy in the mail If you have any lab test that is abnormal or we need to change your treatment, we will call you to review the results.   Testing/Procedures: Your physician has requested that you have an exercise tolerance test prior to 1 yr follow up appointment. For further information please visit [email or web address]. Please also follow instruction sheet, as given.   Follow-Up: At [facility], you and your health needs are our priority.  As part of our continuing mission to provide you with exceptional heart care, we have created designated Provider Care Teams.  These Care Teams include your primary Cardiologist (physician) and Advanced Practice Providers (APPs -  Physician Assistants and Nurse Practitioners) who all work together to provide you with the care you need, when you need it.   Your next appointment:   1 year(s)  Provider:   Pixie Casino, MD  or Nicholes Rough, PA-C

## 2022-07-02 LAB — HEPATIC FUNCTION PANEL
ALT: 16 IU/L (ref 0–44)
AST: 25 IU/L (ref 0–40)
Albumin: 4.4 g/dL (ref 3.8–4.9)
Alkaline Phosphatase: 49 IU/L (ref 44–121)
Bilirubin Total: 1.2 mg/dL (ref 0.0–1.2)
Bilirubin, Direct: 0.31 mg/dL (ref 0.00–0.40)
Total Protein: 6.5 g/dL (ref 6.0–8.5)

## 2022-07-02 LAB — LIPID PANEL
Chol/HDL Ratio: 2 ratio (ref 0.0–5.0)
Cholesterol, Total: 107 mg/dL (ref 100–199)
HDL: 54 mg/dL (ref >39)
LDL Chol Calc (NIH): 39 mg/dL (ref 0–99)
Triglycerides: 61 mg/dL (ref 0–149)
VLDL Cholesterol Cal: 14 mg/dL (ref 5–40)

## 2022-07-12 ENCOUNTER — Other Ambulatory Visit (HOSPITAL_BASED_OUTPATIENT_CLINIC_OR_DEPARTMENT_OTHER): Payer: Self-pay | Admitting: Internal Medicine

## 2022-08-26 ENCOUNTER — Other Ambulatory Visit (HOSPITAL_BASED_OUTPATIENT_CLINIC_OR_DEPARTMENT_OTHER): Payer: Self-pay | Admitting: Internal Medicine

## 2023-06-15 ENCOUNTER — Ambulatory Visit: Payer: 59 | Attending: Physician Assistant

## 2023-06-15 DIAGNOSIS — I10 Essential (primary) hypertension: Secondary | ICD-10-CM | POA: Diagnosis not present

## 2023-06-15 DIAGNOSIS — I2510 Atherosclerotic heart disease of native coronary artery without angina pectoris: Secondary | ICD-10-CM

## 2023-06-15 DIAGNOSIS — Z0289 Encounter for other administrative examinations: Secondary | ICD-10-CM

## 2023-06-16 ENCOUNTER — Other Ambulatory Visit (HOSPITAL_BASED_OUTPATIENT_CLINIC_OR_DEPARTMENT_OTHER): Payer: Self-pay | Admitting: Internal Medicine

## 2023-06-16 ENCOUNTER — Encounter: Payer: Self-pay | Admitting: Physician Assistant

## 2023-06-16 LAB — EXERCISE TOLERANCE TEST
Angina Index: 0
Duke Treadmill Score: 13
Estimated workload: 15.1
Exercise duration (min): 13 min
Exercise duration (sec): 0 s
MPHR: 165 {beats}/min
Peak HR: 173 {beats}/min
Percent HR: 104 %
RPE: 15
Rest HR: 71 {beats}/min
ST Depression (mm): 0 mm

## 2023-06-25 NOTE — Progress Notes (Unsigned)
 Office Visit    Patient Name: Tyler Acevedo. Date of Encounter: 06/25/2023  PCP:  Hortense Ramal, MD   [facility]  Cardiologist:  Chrystie Nose, MD  Advanced Practice Provider:  No care team member to display Electrophysiologist:  None    Chief Complaint    Tyler Acevedo. is a 58 y.o. male with a hx of CAD by cardiac catheterization in 2010 (60% mid LAD lesion which was negative by FFR and nonsignificant by IVUS) and hypertension presents today for follow-up for annual DOT clearance.    He was last seen by Dr. Rennis Golden in March 2022 and had no new complaints.  He continued to be active and tracking and does a lot of moving and lifting without any pain or shortness of breath.  He has maintained on Ranexa, however this might not be necessary.  He has had marked improvement in his cholesterol profile.  His Lipitor was decreased back to 40 mg daily.  He underwent a stress test for DOT purposes.  All stress test have been negative for ischemia in spite of 60% LAD lesion which was not flow-limiting.  He has been followed since 2017 annually.  When last seen March 2022 he was overall doing really well.  He had lost a lot of weight and was down to 183 pounds.  He did this in combination with his wife who follows the [employer] plan.  He remained active.  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  His EKG showed sinus bradycardia, rate 57 bpm.  Blood pressure was well controlled.  His LDL has been quite low and well controlled.  He was seen by me 2/24 and  he felt good without any cardiac symptoms. He has not had any chest pain or SOB. He stays active when he is on the road and continues to eat a heart healthy lifestyle. He has maintained his weight and enjoys spending time with his grandkids. He has been on the same cardiac medications and has been compliant. We have not made any medication changes today. His BP remains well controlled today and at home.  He was just at the lab and had a lipid panel and CMP drawn. We will follow-up with those results.   I saw him 07/01/2022, he tells me he has had no new cardiac issues.  He continues to remain active.  No chest pain or shortness of breath.  He continues to spend time with his 4 grandkids.  He has a new one that just turned 6 months.  He is here for his annual DOT clearance appointment.  Next year he will require a stress test for clearance and this has been arranged today.  Reports no shortness of breath nor dyspnea on exertion. Reports no chest pain, pressure, or tightness. No edema, orthopnea, PND. Reports no palpitations.   Today, he***  Past Medical History    Past Medical History:  Diagnosis Date   CAD (coronary artery disease)    History of nuclear stress test 06/09/2009   exericse myoview; post stress EF 69%; evidence of mild ischemia in basal anteroseptal region   Hypertension    Past Surgical History:  Procedure Laterality Date   CARDIAC CATHETERIZATION  2010   LA has ostial 10% stenosis, mid LAD hazy 30-40% stenosis   HERNIA REPAIR  2006   KNEE SURGERY  1987   NOSE SURGERY  1985   WRIST SURGERY  1982  Allergies  No Known Allergies  EKGs/Labs/Other Studies Reviewed:   The following studies were reviewed today:  Stress test 07/16/21  Personally reviewed exercise stress test - some mild EKG changes, however, would consider negative - he did >12 minutes of exercise. No chest pain symptoms.  Ok to continue DOT driving.  -Dr. Rennis Golden  Stress test 09/19/2014  Low risk pharmacological stress nuclear study with normal perfusion and normal left ventricular regional and global systolic function.    EKG:  EKG is  ordered today.  The ekg ordered today demonstrates NSR rate 59 bpm  Recent Labs: 07/01/2022: ALT 16  Recent Lipid Panel    Component Value Date/Time   CHOL 107 07/01/2022 0902   CHOL 110 09/19/2014 1335   TRIG 61 07/01/2022 0902   TRIG 131 09/19/2014 1335   HDL  54 07/01/2022 0902   HDL 44 09/19/2014 1335   CHOLHDL 2.0 07/01/2022 0902   CHOLHDL 4.3 04/30/2008 0447   VLDL 47 (H) 04/30/2008 0447   LDLCALC 39 07/01/2022 0902   LDLCALC 40 09/19/2014 1335     Home Medications   No outpatient medications have been marked as taking for the 06/26/23 encounter (Appointment) with Sharlene Dory, PA-C.     Review of Systems      All other systems reviewed and are otherwise negative except as noted above.  Physical Exam    VS:  There were no vitals taken for this visit. , BMI There is no height or weight on file to calculate BMI.  Wt Readings from Last 3 Encounters:  07/01/22 187 lb 3.2 oz (84.9 kg)  06/11/21 188 lb 6.4 oz (85.5 kg)  06/26/20 183 lb (83 kg)     GEN: Well nourished, well developed, in no acute distress. HEENT: normal. Neck: Supple, no JVD, carotid bruits, or masses. Cardiac: RRR, no murmurs, rubs, or gallops. No clubbing, cyanosis, edema.  Radials/PT 2+ and equal bilaterally.  Respiratory:  Respirations regular and unlabored, clear to auscultation bilaterally. GI: Soft, nontender, nondistended. MS: No deformity or atrophy. Skin: Warm and dry, no rash. Neuro:  Strength and sensation are intact. Psych: Normal affect.  Assessment & Plan    CAD, 60% mid LAD lesion in 2010 -no chest pain or SOB -Continue current medication regimen including aspirin 81 mg daily, Lipitor 40 mg daily, metoprolol succinate 25 mg daily, nitro as needed, omega-3 fish oil 1200 mg twice a day -Has maintained a healthy lifestyle with diet and exercise  Dyslipidemia -lipid panel today -LDL 42, HDL 55, total cholesterol 161, triglycerides 87 (06/11/2021) -continue Lipitor 40mg  daily  Hypertension -132/82 -Continue to monitor blood pressure at home    Disposition: Follow up 1 year with Chrystie Nose, MD or APP.  Signed, Sharlene Dory, PA-C 06/25/2023, 9:45 PM [facility]

## 2023-06-26 ENCOUNTER — Encounter: Payer: Self-pay | Admitting: Physician Assistant

## 2023-06-26 ENCOUNTER — Ambulatory Visit: Payer: 59 | Attending: Physician Assistant | Admitting: Physician Assistant

## 2023-06-26 VITALS — BP 138/80 | HR 59 | Ht 68.0 in | Wt 190.2 lb

## 2023-06-26 DIAGNOSIS — I10 Essential (primary) hypertension: Secondary | ICD-10-CM

## 2023-06-26 DIAGNOSIS — E785 Hyperlipidemia, unspecified: Secondary | ICD-10-CM

## 2023-06-26 DIAGNOSIS — I2510 Atherosclerotic heart disease of native coronary artery without angina pectoris: Secondary | ICD-10-CM

## 2023-06-26 NOTE — Patient Instructions (Signed)
 Lab Work: FASTING Lipid panel, CBC, CMET If you have labs (blood work) drawn today and your tests are completely normal, you will receive your results only by: MyChart Message (if you have MyChart) OR A paper copy in the mail If you have any lab test that is abnormal or we need to change your treatment, we will call you to review the results.   Follow-Up: At [facility], you and your health needs are our priority.  As part of our continuing mission to provide you with exceptional heart care, we have created designated Provider Care Teams.  These Care Teams include your primary Cardiologist (physician) and Advanced Practice Providers (APPs -  Physician Assistants and Nurse Practitioners) who all work together to provide you with the care you need, when you need it.  We recommend signing up for the patient portal called "MyChart".  Sign up information is provided on this After Visit Summary.  MyChart is used to connect with patients for Virtual Visits (Telemedicine).  Patients are able to view lab/test results, encounter notes, upcoming appointments, etc.  Non-urgent messages can be sent to your provider as well.   To learn more about what you can do with MyChart, go to [email or web address].    Your next appointment:   1 year(s)  Provider:   Chrystie Nose, MD     Other Instructions   1st Floor: - Lobby - Registration  - Pharmacy  - Lab - Cafe  2nd Floor: - PV Lab - Diagnostic Testing (echo, CT, nuclear med)  3rd Floor: - Vacant  4th Floor: - TCTS (cardiothoracic surgery) - AFib Clinic - Structural Heart Clinic - Vascular Surgery  - Vascular Ultrasound  5th Floor: - HeartCare Cardiology (general and EP) - Clinical Pharmacy for coumadin, hypertension, lipid, weight-loss medications, and med management appointments    Valet parking services will be available as well.

## 2023-06-27 LAB — COMPREHENSIVE METABOLIC PANEL
ALT: 23 IU/L (ref 0–44)
AST: 21 IU/L (ref 0–40)
Albumin: 4.6 g/dL (ref 3.8–4.9)
Alkaline Phosphatase: 53 IU/L (ref 44–121)
BUN/Creatinine Ratio: 12 (ref 9–20)
BUN: 11 mg/dL (ref 6–24)
Bilirubin Total: 0.9 mg/dL (ref 0.0–1.2)
CO2: 27 mmol/L (ref 20–29)
Calcium: 9.1 mg/dL (ref 8.7–10.2)
Chloride: 102 mmol/L (ref 96–106)
Creatinine, Ser: 0.95 mg/dL (ref 0.76–1.27)
Globulin, Total: 1.7 g/dL (ref 1.5–4.5)
Glucose: 95 mg/dL (ref 70–99)
Potassium: 4.6 mmol/L (ref 3.5–5.2)
Sodium: 142 mmol/L (ref 134–144)
Total Protein: 6.3 g/dL (ref 6.0–8.5)
eGFR: 95 mL/min/{1.73_m2} (ref >59)

## 2023-06-27 LAB — CBC
Hematocrit: 43.9 % (ref 37.5–51.0)
Hemoglobin: 14.9 g/dL (ref 13.0–17.7)
MCH: 29.7 pg (ref 26.6–33.0)
MCHC: 33.9 g/dL (ref 31.5–35.7)
MCV: 88 fL (ref 79–97)
Platelets: 209 10*3/uL (ref 150–450)
RBC: 5.01 x10E6/uL (ref 4.14–5.80)
RDW: 12.6 % (ref 11.6–15.4)
WBC: 5.5 10*3/uL (ref 3.4–10.8)

## 2023-06-27 LAB — LIPID PANEL
Chol/HDL Ratio: 2 ratio (ref 0.0–5.0)
Cholesterol, Total: 114 mg/dL (ref 100–199)
HDL: 56 mg/dL (ref >39)
LDL Chol Calc (NIH): 45 mg/dL (ref 0–99)
Triglycerides: 59 mg/dL (ref 0–149)
VLDL Cholesterol Cal: 13 mg/dL (ref 5–40)

## 2023-06-29 ENCOUNTER — Encounter: Payer: Self-pay | Admitting: Physician Assistant

## 2023-07-12 ENCOUNTER — Other Ambulatory Visit (HOSPITAL_BASED_OUTPATIENT_CLINIC_OR_DEPARTMENT_OTHER): Payer: Self-pay | Admitting: Internal Medicine

## 2023-09-04 ENCOUNTER — Other Ambulatory Visit (HOSPITAL_BASED_OUTPATIENT_CLINIC_OR_DEPARTMENT_OTHER): Payer: Self-pay | Admitting: Internal Medicine

## 2024-05-28 ENCOUNTER — Ambulatory Visit: Admitting: Physician Assistant

## 2024-06-07 ENCOUNTER — Ambulatory Visit: Admitting: Physician Assistant

## 2024-06-27 ENCOUNTER — Ambulatory Visit: Admitting: Physician Assistant
